# Patient Record
Sex: Male | Born: 1955 | Hispanic: No | Marital: Married | Smoking: Current every day smoker
Health system: Southern US, Community
[De-identification: ages and names within clinical notes are randomized; demographics above are authoritative.]

## PROBLEM LIST (undated history)

## (undated) DIAGNOSIS — M545 Low back pain, unspecified: Secondary | ICD-10-CM

## (undated) DIAGNOSIS — E7800 Pure hypercholesterolemia, unspecified: Secondary | ICD-10-CM

## (undated) DIAGNOSIS — I2510 Atherosclerotic heart disease of native coronary artery without angina pectoris: Secondary | ICD-10-CM

## (undated) DIAGNOSIS — E119 Type 2 diabetes mellitus without complications: Secondary | ICD-10-CM

## (undated) DIAGNOSIS — I420 Dilated cardiomyopathy: Secondary | ICD-10-CM

## (undated) DIAGNOSIS — G8929 Other chronic pain: Secondary | ICD-10-CM

---

## 2002-10-14 ENCOUNTER — Encounter: Admission: RE | Admit: 2002-10-14 | Discharge: 2003-01-12 | Payer: Self-pay | Admitting: Family Medicine

## 2005-03-03 ENCOUNTER — Emergency Department (HOSPITAL_COMMUNITY): Admission: EM | Admit: 2005-03-03 | Discharge: 2005-03-03 | Payer: Self-pay | Admitting: Emergency Medicine

## 2006-04-23 ENCOUNTER — Encounter: Admission: RE | Admit: 2006-04-23 | Discharge: 2006-04-23 | Payer: Self-pay | Admitting: Cardiology

## 2006-07-31 ENCOUNTER — Encounter: Admission: RE | Admit: 2006-07-31 | Discharge: 2006-07-31 | Payer: Self-pay | Admitting: Otolaryngology

## 2008-05-31 ENCOUNTER — Encounter: Admission: RE | Admit: 2008-05-31 | Discharge: 2008-05-31 | Payer: Self-pay | Admitting: Cardiology

## 2011-08-21 ENCOUNTER — Encounter (INDEPENDENT_AMBULATORY_CARE_PROVIDER_SITE_OTHER): Payer: BC Managed Care – PPO | Admitting: Ophthalmology

## 2011-08-21 DIAGNOSIS — E11319 Type 2 diabetes mellitus with unspecified diabetic retinopathy without macular edema: Secondary | ICD-10-CM

## 2011-08-21 DIAGNOSIS — H43819 Vitreous degeneration, unspecified eye: Secondary | ICD-10-CM

## 2011-08-21 DIAGNOSIS — H2510 Age-related nuclear cataract, unspecified eye: Secondary | ICD-10-CM

## 2012-08-20 ENCOUNTER — Encounter (INDEPENDENT_AMBULATORY_CARE_PROVIDER_SITE_OTHER): Payer: BC Managed Care – PPO | Admitting: Ophthalmology

## 2012-10-21 ENCOUNTER — Ambulatory Visit (INDEPENDENT_AMBULATORY_CARE_PROVIDER_SITE_OTHER): Payer: BC Managed Care – PPO | Admitting: Ophthalmology

## 2012-10-21 DIAGNOSIS — E1165 Type 2 diabetes mellitus with hyperglycemia: Secondary | ICD-10-CM

## 2012-10-21 DIAGNOSIS — E11319 Type 2 diabetes mellitus with unspecified diabetic retinopathy without macular edema: Secondary | ICD-10-CM

## 2012-10-21 DIAGNOSIS — H2510 Age-related nuclear cataract, unspecified eye: Secondary | ICD-10-CM

## 2012-10-21 DIAGNOSIS — H43819 Vitreous degeneration, unspecified eye: Secondary | ICD-10-CM

## 2012-11-18 ENCOUNTER — Encounter (INDEPENDENT_AMBULATORY_CARE_PROVIDER_SITE_OTHER): Payer: BC Managed Care – PPO | Admitting: Ophthalmology

## 2012-12-08 ENCOUNTER — Encounter (INDEPENDENT_AMBULATORY_CARE_PROVIDER_SITE_OTHER): Payer: BC Managed Care – PPO | Admitting: Ophthalmology

## 2012-12-08 DIAGNOSIS — E11319 Type 2 diabetes mellitus with unspecified diabetic retinopathy without macular edema: Secondary | ICD-10-CM

## 2012-12-08 DIAGNOSIS — H43819 Vitreous degeneration, unspecified eye: Secondary | ICD-10-CM

## 2012-12-08 DIAGNOSIS — E1139 Type 2 diabetes mellitus with other diabetic ophthalmic complication: Secondary | ICD-10-CM

## 2012-12-08 DIAGNOSIS — H2510 Age-related nuclear cataract, unspecified eye: Secondary | ICD-10-CM

## 2013-12-08 ENCOUNTER — Ambulatory Visit (INDEPENDENT_AMBULATORY_CARE_PROVIDER_SITE_OTHER): Payer: BC Managed Care – PPO | Admitting: Ophthalmology

## 2015-06-08 ENCOUNTER — Ambulatory Visit
Admission: RE | Admit: 2015-06-08 | Discharge: 2015-06-08 | Disposition: A | Discharge status: Home / Self Care | Payer: No Typology Code available for payment source | Source: Ambulatory Visit | Attending: Cardiology | Admitting: Cardiology

## 2015-06-08 ENCOUNTER — Other Ambulatory Visit: Payer: Self-pay | Admitting: Cardiology

## 2015-06-08 DIAGNOSIS — M25561 Pain in right knee: Secondary | ICD-10-CM

## 2015-08-19 ENCOUNTER — Other Ambulatory Visit (HOSPITAL_COMMUNITY): Payer: Self-pay | Admitting: Cardiology

## 2015-08-19 DIAGNOSIS — R0600 Dyspnea, unspecified: Secondary | ICD-10-CM

## 2015-08-19 DIAGNOSIS — R0789 Other chest pain: Secondary | ICD-10-CM

## 2015-08-25 ENCOUNTER — Telehealth (HOSPITAL_COMMUNITY): Payer: Self-pay | Admitting: *Deleted

## 2015-08-25 NOTE — Telephone Encounter (Signed)
Left message on voicemail in reference to upcoming appointment scheduled for 08/30/15. Phone number given for a call back so details instructions can be given. Krist Rosenboom J Makeshia Seat, RN 

## 2015-08-25 NOTE — Telephone Encounter (Signed)
Patient given detailed instructions per Myocardial Perfusion Study Information Sheet for test on 08/30/15 at 1200. Patient notified to arrive 15 minutes early and that it is imperative to arrive on time for appointment to keep from having the test rescheduled.  If you need to cancel or reschedule your appointment, please call the office within 24 hours of your appointment. Failure to do so may result in a cancellation of your appointment, and a $50 no show fee. Patient verbalized understanding. Kayron Hicklin J Kaleel Schmieder, RN 

## 2015-08-29 ENCOUNTER — Telehealth (HOSPITAL_COMMUNITY): Payer: Self-pay

## 2015-08-29 NOTE — Telephone Encounter (Signed)
Encounter complete. 

## 2015-08-30 ENCOUNTER — Other Ambulatory Visit: Payer: Self-pay

## 2015-08-30 ENCOUNTER — Ambulatory Visit (HOSPITAL_BASED_OUTPATIENT_CLINIC_OR_DEPARTMENT_OTHER): Payer: Medicaid Other

## 2015-08-30 ENCOUNTER — Ambulatory Visit (HOSPITAL_COMMUNITY): Payer: Medicaid Other | Attending: Cardiology

## 2015-08-30 DIAGNOSIS — R0600 Dyspnea, unspecified: Secondary | ICD-10-CM | POA: Diagnosis present

## 2015-08-30 DIAGNOSIS — I5189 Other ill-defined heart diseases: Secondary | ICD-10-CM | POA: Diagnosis not present

## 2015-08-30 DIAGNOSIS — E119 Type 2 diabetes mellitus without complications: Secondary | ICD-10-CM | POA: Diagnosis not present

## 2015-08-30 DIAGNOSIS — R0789 Other chest pain: Secondary | ICD-10-CM

## 2015-08-30 DIAGNOSIS — E785 Hyperlipidemia, unspecified: Secondary | ICD-10-CM | POA: Diagnosis not present

## 2015-08-30 DIAGNOSIS — I340 Nonrheumatic mitral (valve) insufficiency: Secondary | ICD-10-CM | POA: Insufficient documentation

## 2015-08-30 MED ORDER — TECHNETIUM TC 99M SESTAMIBI GENERIC - CARDIOLITE
29.5000 | Freq: Once | INTRAVENOUS | Status: AC | PRN
  Administered 2015-08-30: 30 via INTRAVENOUS

## 2015-09-01 ENCOUNTER — Ambulatory Visit (HOSPITAL_COMMUNITY): Payer: No Typology Code available for payment source | Attending: Cardiology

## 2015-09-01 DIAGNOSIS — I340 Nonrheumatic mitral (valve) insufficiency: Secondary | ICD-10-CM | POA: Diagnosis not present

## 2015-09-01 DIAGNOSIS — R0989 Other specified symptoms and signs involving the circulatory and respiratory systems: Secondary | ICD-10-CM

## 2015-09-01 LAB — MYOCARDIAL PERFUSION IMAGING
CHL CUP NUCLEAR SDS: 1
CHL CUP NUCLEAR SRS: 23
CHL CUP RESTING HR STRESS: 70 {beats}/min
LHR: 0.37
LV sys vol: 179 mL
LVDIAVOL: 232 mL
Peak HR: 85 {beats}/min
SSS: 24
TID: 0.99

## 2015-09-01 MED ORDER — TECHNETIUM TC 99M SESTAMIBI GENERIC - CARDIOLITE
31.5000 | Freq: Once | INTRAVENOUS | Status: AC | PRN
  Administered 2015-09-01: 32 via INTRAVENOUS

## 2015-09-01 MED ORDER — REGADENOSON 0.4 MG/5ML IV SOLN
0.4000 mg | Freq: Once | INTRAVENOUS | Status: AC
  Administered 2015-09-01: 0.4 mg via INTRAVENOUS

## 2015-09-15 ENCOUNTER — Other Ambulatory Visit: Payer: Self-pay | Admitting: Cardiovascular Disease

## 2015-09-16 ENCOUNTER — Encounter (HOSPITAL_COMMUNITY)
Admission: RE | Disposition: A | Discharge status: Home / Self Care | Payer: No Typology Code available for payment source | Source: Ambulatory Visit | Attending: Cardiothoracic Surgery

## 2015-09-16 ENCOUNTER — Ambulatory Visit (HOSPITAL_COMMUNITY): Payer: Medicaid Other

## 2015-09-16 ENCOUNTER — Inpatient Hospital Stay (HOSPITAL_COMMUNITY)
Admission: RE | Admit: 2015-09-16 | Discharge: 2015-09-26 | DRG: 234 | Disposition: A | Discharge status: Home / Self Care | Payer: Medicaid Other | Source: Ambulatory Visit | Attending: Cardiothoracic Surgery | Admitting: Cardiothoracic Surgery

## 2015-09-16 ENCOUNTER — Encounter (HOSPITAL_COMMUNITY): Payer: Self-pay | Admitting: *Deleted

## 2015-09-16 ENCOUNTER — Other Ambulatory Visit: Payer: Self-pay | Admitting: *Deleted

## 2015-09-16 DIAGNOSIS — Z8249 Family history of ischemic heart disease and other diseases of the circulatory system: Secondary | ICD-10-CM

## 2015-09-16 DIAGNOSIS — I25110 Atherosclerotic heart disease of native coronary artery with unstable angina pectoris: Principal | ICD-10-CM | POA: Diagnosis present

## 2015-09-16 DIAGNOSIS — E8770 Fluid overload, unspecified: Secondary | ICD-10-CM | POA: Diagnosis not present

## 2015-09-16 DIAGNOSIS — D62 Acute posthemorrhagic anemia: Secondary | ICD-10-CM | POA: Diagnosis not present

## 2015-09-16 DIAGNOSIS — R079 Chest pain, unspecified: Secondary | ICD-10-CM | POA: Diagnosis present

## 2015-09-16 DIAGNOSIS — I249 Acute ischemic heart disease, unspecified: Secondary | ICD-10-CM | POA: Diagnosis present

## 2015-09-16 DIAGNOSIS — F17210 Nicotine dependence, cigarettes, uncomplicated: Secondary | ICD-10-CM | POA: Diagnosis present

## 2015-09-16 DIAGNOSIS — I2510 Atherosclerotic heart disease of native coronary artery without angina pectoris: Secondary | ICD-10-CM

## 2015-09-16 DIAGNOSIS — K0510 Chronic gingivitis, plaque induced: Secondary | ICD-10-CM | POA: Diagnosis present

## 2015-09-16 DIAGNOSIS — Z4682 Encounter for fitting and adjustment of non-vascular catheter: Secondary | ICD-10-CM

## 2015-09-16 DIAGNOSIS — Z951 Presence of aortocoronary bypass graft: Secondary | ICD-10-CM

## 2015-09-16 DIAGNOSIS — E785 Hyperlipidemia, unspecified: Secondary | ICD-10-CM | POA: Diagnosis present

## 2015-09-16 DIAGNOSIS — I2582 Chronic total occlusion of coronary artery: Secondary | ICD-10-CM | POA: Diagnosis present

## 2015-09-16 DIAGNOSIS — J9811 Atelectasis: Secondary | ICD-10-CM | POA: Diagnosis not present

## 2015-09-16 DIAGNOSIS — I252 Old myocardial infarction: Secondary | ICD-10-CM

## 2015-09-16 DIAGNOSIS — Z833 Family history of diabetes mellitus: Secondary | ICD-10-CM

## 2015-09-16 DIAGNOSIS — T8384XA Pain from genitourinary prosthetic devices, implants and grafts, initial encounter: Secondary | ICD-10-CM | POA: Diagnosis not present

## 2015-09-16 DIAGNOSIS — E1165 Type 2 diabetes mellitus with hyperglycemia: Secondary | ICD-10-CM | POA: Diagnosis present

## 2015-09-16 DIAGNOSIS — Z7982 Long term (current) use of aspirin: Secondary | ICD-10-CM

## 2015-09-16 DIAGNOSIS — Z823 Family history of stroke: Secondary | ICD-10-CM

## 2015-09-16 DIAGNOSIS — Y846 Urinary catheterization as the cause of abnormal reaction of the patient, or of later complication, without mention of misadventure at the time of the procedure: Secondary | ICD-10-CM | POA: Diagnosis not present

## 2015-09-16 DIAGNOSIS — I493 Ventricular premature depolarization: Secondary | ICD-10-CM | POA: Diagnosis not present

## 2015-09-16 DIAGNOSIS — F419 Anxiety disorder, unspecified: Secondary | ICD-10-CM | POA: Diagnosis not present

## 2015-09-16 DIAGNOSIS — I420 Dilated cardiomyopathy: Secondary | ICD-10-CM | POA: Diagnosis present

## 2015-09-16 HISTORY — DX: Dilated cardiomyopathy: I42.0

## 2015-09-16 HISTORY — PX: CARDIAC CATHETERIZATION: SHX172

## 2015-09-16 HISTORY — DX: Atherosclerotic heart disease of native coronary artery without angina pectoris: I25.10

## 2015-09-16 HISTORY — DX: Pure hypercholesterolemia, unspecified: E78.00

## 2015-09-16 HISTORY — DX: Low back pain, unspecified: M54.50

## 2015-09-16 HISTORY — DX: Low back pain: M54.5

## 2015-09-16 HISTORY — DX: Type 2 diabetes mellitus without complications: E11.9

## 2015-09-16 HISTORY — DX: Other chronic pain: G89.29

## 2015-09-16 LAB — BRAIN NATRIURETIC PEPTIDE: B NATRIURETIC PEPTIDE 5: 130.8 pg/mL — AB (ref 0.0–100.0)

## 2015-09-16 LAB — PULMONARY FUNCTION TEST
FEF 25-75 Pre: 1.81 L/sec
FEF2575-%Pred-Pre: 76 %
FEV1-%Pred-Pre: 75 %
FEV1-Pre: 2.04 L
FEV1FVC-%Pred-Pre: 95 %
FEV6-%Pred-Pre: 83 %
FEV6-Pre: 2.8 L
FEV6FVC-%Pred-Pre: 105 %
FVC-%Pred-Pre: 78 %
FVC-Pre: 2.8 L
Pre FEV1/FVC ratio: 73 %
Pre FEV6/FVC Ratio: 100 %

## 2015-09-16 LAB — BASIC METABOLIC PANEL
ANION GAP: 10 (ref 5–15)
BUN: 13 mg/dL (ref 6–20)
CHLORIDE: 104 mmol/L (ref 101–111)
CO2: 24 mmol/L (ref 22–32)
CREATININE: 0.62 mg/dL (ref 0.61–1.24)
Calcium: 9.5 mg/dL (ref 8.9–10.3)
GFR calc non Af Amer: >60 mL/min (ref >60)
Glucose, Bld: 214 mg/dL — ABNORMAL HIGH (ref 65–99)
POTASSIUM: 4.1 mmol/L (ref 3.5–5.1)
SODIUM: 138 mmol/L (ref 135–145)

## 2015-09-16 LAB — GLUCOSE, CAPILLARY
GLUCOSE-CAPILLARY: 209 mg/dL — AB (ref 65–99)
GLUCOSE-CAPILLARY: 166 mg/dL — AB (ref 65–99)
GLUCOSE-CAPILLARY: 238 mg/dL — AB (ref 65–99)
Glucose-Capillary: 177 mg/dL — ABNORMAL HIGH (ref 65–99)
Glucose-Capillary: 200 mg/dL — ABNORMAL HIGH (ref 65–99)

## 2015-09-16 LAB — CBC
HEMATOCRIT: 47.3 % (ref 39.0–52.0)
HEMOGLOBIN: 15.6 g/dL (ref 13.0–17.0)
MCH: 28.2 pg (ref 26.0–34.0)
MCHC: 33 g/dL (ref 30.0–36.0)
MCV: 85.4 fL (ref 78.0–100.0)
Platelets: 205 10*3/uL (ref 150–400)
RBC: 5.54 MIL/uL (ref 4.22–5.81)
RDW: 13.4 % (ref 11.5–15.5)
WBC: 5.5 10*3/uL (ref 4.0–10.5)

## 2015-09-16 LAB — PROTIME-INR
INR: 0.96 (ref 0.00–1.49)
Prothrombin Time: 13 seconds (ref 11.6–15.2)

## 2015-09-16 LAB — POCT I-STAT 3, ART BLOOD GAS (G3+)
Acid-Base Excess: 2 mmol/L (ref 0.0–2.0)
Bicarbonate: 29.1 mEq/L — ABNORMAL HIGH (ref 20.0–24.0)
O2 Saturation: 98 %
PCO2 ART: 53.5 mmHg — AB (ref 35.0–45.0)
PH ART: 7.344 — AB (ref 7.350–7.450)
TCO2: 31 mmol/L (ref 0–100)
pO2, Arterial: 108 mmHg — ABNORMAL HIGH (ref 80.0–100.0)

## 2015-09-16 LAB — POCT I-STAT 3, VENOUS BLOOD GAS (G3P V)
BICARBONATE: 26.8 meq/L — AB (ref 20.0–24.0)
O2 Saturation: 73 %
PCO2 VEN: 53.2 mmHg — AB (ref 45.0–50.0)
PH VEN: 7.31 — AB (ref 7.250–7.300)
PO2 VEN: 43 mmHg (ref 30.0–45.0)
TCO2: 28 mmol/L (ref 0–100)

## 2015-09-16 LAB — TROPONIN I: Troponin I: <0.03 ng/mL (ref <0.031)

## 2015-09-16 LAB — PLATELET INHIBITION P2Y12: Platelet Function  P2Y12: 199 [PRU] (ref 194–418)

## 2015-09-16 SURGERY — RIGHT/LEFT HEART CATH AND CORONARY ANGIOGRAPHY

## 2015-09-16 MED ORDER — INSULIN ASPART 100 UNIT/ML ~~LOC~~ SOLN
0.0000 [IU] | Freq: Three times a day (TID) | SUBCUTANEOUS | Status: DC
  Administered 2015-09-17 (×3): 3 [IU] via SUBCUTANEOUS
  Administered 2015-09-18: 8 [IU] via SUBCUTANEOUS
  Administered 2015-09-18 (×2): 3 [IU] via SUBCUTANEOUS
  Administered 2015-09-19: 5 [IU] via SUBCUTANEOUS

## 2015-09-16 MED ORDER — ACETAMINOPHEN 325 MG PO TABS: 650.0000 mg | ORAL_TABLET | ORAL | Status: DC | PRN

## 2015-09-16 MED ORDER — ONDANSETRON HCL 4 MG/2ML IJ SOLN: 4.0000 mg | Freq: Four times a day (QID) | INTRAMUSCULAR | Status: DC | PRN

## 2015-09-16 MED ORDER — LIDOCAINE HCL (PF) 1 % IJ SOLN
INTRAMUSCULAR | Status: AC
  Filled 2015-09-16: qty 30

## 2015-09-16 MED ORDER — SODIUM CHLORIDE 0.9 % IJ SOLN: 3.0000 mL | Freq: Two times a day (BID) | INTRAMUSCULAR | Status: DC

## 2015-09-16 MED ORDER — SODIUM CHLORIDE 0.9 % IV SOLN: 250.0000 mL | INTRAVENOUS | Status: DC | PRN

## 2015-09-16 MED ORDER — SODIUM CHLORIDE 0.9 % IV SOLN
250.0000 mL | INTRAVENOUS | Status: DC | PRN
  Administered 2015-09-19: 14:00:00 via INTRAVENOUS

## 2015-09-16 MED ORDER — ASPIRIN 81 MG PO CHEW
81.0000 mg | CHEWABLE_TABLET | ORAL | Status: AC
  Administered 2015-09-16: 81 mg via ORAL

## 2015-09-16 MED ORDER — ALBUTEROL SULFATE (2.5 MG/3ML) 0.083% IN NEBU: 2.5000 mg | INHALATION_SOLUTION | Freq: Once | RESPIRATORY_TRACT | Status: DC

## 2015-09-16 MED ORDER — SODIUM CHLORIDE 0.9 % IJ SOLN: 3.0000 mL | INTRAMUSCULAR | Status: DC | PRN

## 2015-09-16 MED ORDER — FENTANYL CITRATE (PF) 100 MCG/2ML IJ SOLN
INTRAMUSCULAR | Status: AC
  Filled 2015-09-16: qty 4

## 2015-09-16 MED ORDER — FENTANYL CITRATE (PF) 100 MCG/2ML IJ SOLN
INTRAMUSCULAR | Status: DC | PRN
  Administered 2015-09-16 (×2): 25 ug via INTRAVENOUS

## 2015-09-16 MED ORDER — SODIUM CHLORIDE 0.9 % IV SOLN
INTRAVENOUS | Status: DC
  Administered 2015-09-16: 11:00:00 via INTRAVENOUS

## 2015-09-16 MED ORDER — ASPIRIN EC 81 MG PO TBEC
81.0000 mg | DELAYED_RELEASE_TABLET | Freq: Every day | ORAL | Status: DC
  Administered 2015-09-17 – 2015-09-18 (×2): 81 mg via ORAL
  Filled 2015-09-16 (×2): qty 1

## 2015-09-16 MED ORDER — ALBUTEROL SULFATE (2.5 MG/3ML) 0.083% IN NEBU
2.5000 mg | INHALATION_SOLUTION | Freq: Four times a day (QID) | RESPIRATORY_TRACT | Status: DC
  Filled 2015-09-16: qty 3

## 2015-09-16 MED ORDER — GLIMEPIRIDE 4 MG PO TABS
4.0000 mg | ORAL_TABLET | Freq: Two times a day (BID) | ORAL | Status: DC
  Administered 2015-09-16 – 2015-09-18 (×5): 4 mg via ORAL
  Filled 2015-09-16 (×7): qty 1

## 2015-09-16 MED ORDER — ALBUTEROL SULFATE (2.5 MG/3ML) 0.083% IN NEBU: 2.5000 mg | INHALATION_SOLUTION | Freq: Four times a day (QID) | RESPIRATORY_TRACT | Status: DC | PRN

## 2015-09-16 MED ORDER — ADULT MULTIVITAMIN W/MINERALS CH
1.0000 | ORAL_TABLET | Freq: Every day | ORAL | Status: DC
  Administered 2015-09-16 – 2015-09-18 (×3): 1 via ORAL
  Filled 2015-09-16 (×3): qty 1

## 2015-09-16 MED ORDER — ASPIRIN 81 MG PO CHEW
CHEWABLE_TABLET | ORAL | Status: AC
  Filled 2015-09-16: qty 1

## 2015-09-16 MED ORDER — MIDAZOLAM HCL 2 MG/2ML IJ SOLN
INTRAMUSCULAR | Status: AC
  Filled 2015-09-16: qty 4

## 2015-09-16 MED ORDER — HEPARIN (PORCINE) IN NACL 2-0.9 UNIT/ML-% IJ SOLN
INTRAMUSCULAR | Status: AC
  Filled 2015-09-16: qty 500

## 2015-09-16 MED ORDER — HEPARIN (PORCINE) IN NACL 100-0.45 UNIT/ML-% IJ SOLN
1500.0000 [IU]/h | INTRAMUSCULAR | Status: DC
  Administered 2015-09-16: 800 [IU]/h via INTRAVENOUS
  Administered 2015-09-17: 1350 [IU]/h via INTRAVENOUS
  Filled 2015-09-16 (×4): qty 250

## 2015-09-16 MED ORDER — SODIUM CHLORIDE 0.9 % IJ SOLN
3.0000 mL | Freq: Two times a day (BID) | INTRAMUSCULAR | Status: DC
  Administered 2015-09-16 – 2015-09-18 (×5): 3 mL via INTRAVENOUS

## 2015-09-16 MED ORDER — MIDAZOLAM HCL 2 MG/2ML IJ SOLN
INTRAMUSCULAR | Status: DC | PRN
  Administered 2015-09-16 (×2): 1 mg via INTRAVENOUS

## 2015-09-16 MED ORDER — HEPARIN (PORCINE) IN NACL 2-0.9 UNIT/ML-% IJ SOLN
INTRAMUSCULAR | Status: AC
  Filled 2015-09-16: qty 1000

## 2015-09-16 MED ORDER — INSULIN ASPART 100 UNIT/ML ~~LOC~~ SOLN
0.0000 [IU] | Freq: Once | SUBCUTANEOUS | Status: AC
  Administered 2015-09-16: 5 [IU] via SUBCUTANEOUS

## 2015-09-16 SURGICAL SUPPLY — 12 items
CATH INFINITI 5FR MULTPACK ANG (CATHETERS) ×3 IMPLANT
CATH SWAN GANZ 7F STRAIGHT (CATHETERS) ×3 IMPLANT
KIT HEART LEFT (KITS) ×3 IMPLANT
KIT HEART RIGHT NAMIC (KITS) ×3 IMPLANT
NDL SMART REG 18GX2-3/4 (NEEDLE) IMPLANT
NEEDLE SMART REG 18GX2-3/4 (NEEDLE) ×3 IMPLANT
PACK CARDIAC CATHETERIZATION (CUSTOM PROCEDURE TRAY) ×3 IMPLANT
SHEATH PINNACLE 5F 10CM (SHEATH) ×3 IMPLANT
SHEATH PINNACLE 7F 10CM (SHEATH) ×3 IMPLANT
SYR MEDRAD MARK V 150ML (SYRINGE) ×3 IMPLANT
TRANSDUCER W/STOPCOCK (MISCELLANEOUS) ×6 IMPLANT
WIRE EMERALD 3MM-J .035X150CM (WIRE) ×3 IMPLANT

## 2015-09-16 NOTE — Progress Notes (Signed)
Dr. Algie Coffer and PA talking w/patient

## 2015-09-16 NOTE — Progress Notes (Signed)
ANTICOAGULATION CONSULT NOTE - Initial Consult  Pharmacy Consult for heparin Indication: chest pain/ACS  No Known Allergies  Patient Measurements: Height:  (157.5 cm) Weight: 154 lb 1.6 oz (69.9 kg) (Bed weight r/t bedrest from femoral cath) IBW/kg (Calculated) : 54.6 Heparin Dosing Weight: 68.7 kg  Vital Signs: Temp: 98.2 F (36.8 C) (09/30 1508) Temp Source: Oral (09/30 1508) BP: 126/73 mmHg (09/30 1508) Pulse Rate: 73 (09/30 1508)  Labs:  Recent Labs  09/16/15 1044  HGB 15.6  HCT 47.3  PLT 205  LABPROT 13.0  INR 0.96  CREATININE 0.62    Estimated Creatinine Clearance: 86.4 mL/min (by C-G formula based on Cr of 0.62).   Medical History: History reviewed. No pertinent past medical history.   Assessment: 58 YOM with dilated cardiomyopathy has chest pain and exertional shortness of breath, s/p cath this afternoon and found to have severe CAD requiring CABG. Pharmacy is consulted to start IV heparin 8 hrs after sheath removal (1415). Plan for CABG Monday 10/3. He is not on anticoagulation prior to admission. Baseline hgb 15.6, plt wnl, INR 0.96.  Goal of Therapy:  Heparin level 0.3-0.7 units/ml Monitor platelets by anticoagulation protocol: Yes   Plan:  - Heparin infusion 800 units/hr with no bolus at 2215 - f/u heparin AM heparin level - Daily heparin level and CBC - f/u plans for CABG  Bayard Hugger, PharmD, BCPS  Clinical Pharmacist  Pager: [phone]   09/16/2015,3:50 PM

## 2015-09-16 NOTE — H&P (Signed)
Referring Physician:  WYLIE COON is an 59 y.o. male.                       Chief Complaint: Chest pain  HPI: 59 year old male with dilated cardiomyopathy has chest pain and exertional shortness of breath.  He is tired all the time.  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 No alcohol use, No drug use, + elevated cholesterol, + MI, No obesity, No exercise, No FH of premature CAD.  Past surgical history: None  Family history : Mom, living, 108 yr old has DM, II. Dad died age 63 from stroke and had MI age 54. Nine brothers, all living, 4 with DM, II. 4 sisters all living and well.  Social History:  has decreased smoking to 1/4 pack per day x 2 months with 40 pack year history of smoking.  Personal: Married, wife Armando Reichert 49 yr old. 1 son-18 yr and 3 daughters 62.13 and 8 yr old.  Allergies: No Known Allergies  Prescriptions prior to admission: Glymepiride 4 mg.  Twice daily. Metformin 1000 mg. One twice daily. Aspirin 325 mg. One daily. Crestor 5 mg. One daily.    No results found for this or any previous visit (from the past 48 hour(s)). No results found.  Review Of Systems No weight gain/loss, Wears reading and driving glasses. No cataract surgery. + hearing loss right ear. No dentures, no asthma, + COPD, No pneumonia, No palpitations, + Chest pain. + Claudication, No nausea, vomiting or diarrhea, No GI or GU bleed. No hepatitis, No blood transfusion, No kidney stone, No stroke, seizures or psychiatric admission. + joint pains No rash.  There were no vitals taken for this visit. General appearance: alert, appears older than stated age and no distress Eyes: Black eyes, wears glasses, conjunctivae/corneas clear. PERRL, EOM's intact. Fundi benign. Throat- chronic gingivitis. Resp: clear to auscultation bilaterally Cardio: regular rate and rhythm, S1, S2 normal, no murmur, click, rub or gallop GI: soft, non-tender; bowel sounds normal; no masses,   no organomegaly Extremities: extremities normal, atraumatic, no cyanosis or edema Skin: Skin color, texture, turgor normal. No rashes or lesions Neurologic: Alert and oriented X 3, normal strength and tone. Normal symmetric reflexes. Normal coordination and gait  Assessment/Plan Right and Left heart catheterization. Add Lisinopril 5 mg. one daily. Change Crestor to Pravachol for insurance coverage.  Patient understood procedure, risks and alternatives and wants me to peoceed with the procedure.  Ricki Rodriguez, MD  09/16/2015, 10:43 AM

## 2015-09-16 NOTE — Consult Note (Signed)
[address]       [place] [address]             [phone]        ZAKAR BROSCH [facility] Medical Record #[id number] Date of Birth: 09/30/1956  Referring: Algie Coffer Primary Care: Pola Corn, MD  Chief Complaint:   Fatigue, CAD  History of Present Illness:      Mr. Riddle is a 59 yo male with history of DM, Hyperlipidemia, and smoking history of 2ppd for 20 years.  He states he had not been seen by a physician in 2 years.  He presented to his PCP with complaints of fatigue.  Workup included EKG which had changes likely indicating an old myocardial infarction.  It was felt stress test should be done as well as echocardiogram.  These showed cardiomyopathy with an EF of 25% and positive evidence of ischemia.  Due to this he was referred to Dr. Algie Coffer for further workup.  He felt the patient should undergo cardiac catheterization for further workup.  This was done today and showed severe 3 vessel CAD. Cardiac Surgery was consulted.   He states he feels fatigued which he attributes to working late at night.  He denies chest pain, palpitations, and shortness of breath.  He states that after he eats a large meal he gets a "stress" feeling along his epigastrium.  The patient does not seem to fully understand the severity of his CAD despite extensive counseling by Dr. Algie Coffer.   Current Activity/ Functional Status: Patient is independent with mobility/ambulation, transfers, ADL's, IADL's.   Zubrod Score: At the time of surgery this patient's most appropriate activity status/level should be described as:     0    Normal activity, no symptoms     1    Restricted in physical strenuous activity but ambulatory, able to do out light work     2    Ambulatory and capable of self care, unable to do work activities, up and about                 more than 50%  Of the time                                3    Only limited self care, in bed greater than 50% of waking hours      4    Completely disabled, no self care, confined to bed or chair     5    Moribund  Past medical History: Long standing DM- patient notes that his glucose is always 200-300 Denies previous storke  Past Medical History  Diagnosis Date  . Coronary artery disease   . Hypercholesterolemia     "diet controlled" (09/16/2015)  . Type II diabetes mellitus   . Chronic lower back pain   . Dilated cardiomyopathy     /notes 09/16/2015   Past Surgical History  Procedure Laterality Date  . Cardiac catheterization N/A 09/16/2015    Procedure: Right/Left Heart Cath and Coronary Angiography;  Surgeon: Orpah Cobb, MD;  Location: [facility];  Service: Cardiovascular;  Laterality: N/A;        History  Smoking status  . Current Every Day Smoker -- 1.25 packs/day for 21 years  . Types: Cigarettes  Smokeless tobacco  . Never Used    History  Alcohol Use No    Social  History   Social History  . Marital Status: Married    Spouse Name: N/A  . Number of Children: N/A  . Years of Education: N/A   Occupational History  . Not on file.   Social History Main Topics  . Smoking status: Current Every Day Smoker -- 1.25 packs/day for 21 years    Types: Cigarettes  . Smokeless tobacco: Never Used  . Alcohol Use: No  . Drug Use: No  . Sexual Activity: Yes   Other Topics Concern  . Not on file   Social History Narrative  . No narrative on file    No Known Allergies  Current Facility-Administered Medications  Medication Dose Route Frequency Provider Last Rate Last Dose  . 0.9 %  sodium chloride infusion  250 mL Intravenous PRN Orpah Cobb, MD      . acetaminophen (TYLENOL) tablet 650 mg  650 mg Oral Q4H PRN Orpah Cobb, MD      . albuterol (PROVENTIL) (2.5 MG/3ML) 0.083% nebulizer solution 2.5 mg  2.5 mg Nebulization Once [name], MD   2.5 mg at 09/16/15 1547  . albuterol (PROVENTIL) (2.5 MG/3ML) 0.083% nebulizer solution 2.5 mg  2.5 mg Nebulization Q6H PRN Orpah Cobb, MD      . aspirin EC tablet 81 mg  81 mg Oral Daily Orpah Cobb, MD      . glimepiride (AMARYL) tablet 4 mg  4 mg Oral BID Orpah Cobb, MD   4 mg at 09/16/15 2216  . heparin ADULT infusion 100 units/mL (25000 units/250 mL)  1,100 Units/hr Intravenous Continuous Orpah Cobb, MD 11 mL/hr at 09/17/15 0355 1,100 Units/hr at 09/17/15 0355  . insulin aspart (novoLOG) injection 0-15 Units  0-15 Units Subcutaneous TID WC Orpah Cobb, MD   3 Units at 09/17/15 0619  . multivitamin with minerals tablet 1 tablet  1 tablet Oral Daily Orpah Cobb, MD   1 tablet at 09/16/15 1719  . ondansetron (ZOFRAN) injection 4 mg  4 mg Intravenous Q6H PRN Orpah Cobb, MD      . sodium chloride 0.9 % injection 3 mL  3 mL Intravenous Q12H Orpah Cobb, MD   3 mL at 09/16/15 1720  . sodium chloride 0.9 % injection 3 mL  3 mL Intravenous PRN Orpah Cobb, MD        Prescriptions prior to admission  Medication Sig Dispense Refill Last Dose  . aspirin EC 81 MG tablet Take 81 mg by mouth daily.   09/15/2015 at Unknown time  . glimepiride (AMARYL) 4 MG tablet Take 4 mg by mouth 2 (two) times daily.   09/15/2015 at Unknown time  . metFORMIN (GLUCOPHAGE) 1000 MG tablet Take 1,000 mg by mouth 2 (two) times daily with a meal.   09/15/2015 at Unknown time    History reviewed. No pertinent family history.  Review of Systems:  Constitutional: negative Eyes: negative Respiratory: negative for cough, dyspnea on exertion and pleurisy/chest pain Cardiovascular: negative for chest pain, chest pressure/discomfort, dyspnea, exertional chest pressure/discomfort and irregular heart beat Gastrointestinal: negative for abdominal pain, nausea and vomiting Hematologic/lymphatic: negative Musculoskeletal:negative Neurological: negative    Cardiac Review of Systems: Y or N  Chest Pain [ n   ]  Resting SOB [ n  ] Exertional SOB  Cove.Etienne ]  Orthopnea Cove.Etienne  ]   Pedal Edema [ n  ]    Palpitations [ n ] Syncope  [n  ]   Presyncope [ n   ]  General Review of  Systems: [Y] = yes [  ]=no Constitional: recent weight change [n  ]; anorexia [  ]; fatigue [  ]; nausea [n  ]; night sweats [  ]; fever [  ]; or chills [  ]                                                               Dental: poor dentition[  ]; Last Dentist visit:   Eye : blurred vision [ n ]; diplopia [   ]; vision changes [  ];  Amaurosis fugax[  ]; Resp: cough [n  ];  wheezing[n  ];  hemoptysis[  ]; shortness of breath[n  ]; paroxysmal nocturnal dyspnea[  ]; dyspnea on exertion[ n ]; or orthopnea[  ];  GI:  gallstones[  ], vomiting[ n ];  dysphagia[  ]; melena[  ];  hematochezia [  ]; heartburn[y  ];   Hx of  Colonoscopy[  ]; GU: kidney stones [  ]; hematuria[  ];   dysuria [  ];  nocturia[  ];  history of     obstruction [  ]; urinary frequency [  ]             Skin: rash, swelling[n  ];, hair loss[  ];  peripheral edema[  ];  or itching[  ]; Musculosketetal: myalgias[  ];  joint swelling[  ];  joint erythema[  ];  joint pain[  ];  back pain[  ];  Heme/Lymph: bruising[n  ];  bleeding[  ];  anemia[  ];  Neuro: TIA[  ];  headaches[ n ];  stroke[  ];  vertigo[  ];  seizures[  ];   paresthesias[ n ];  difficulty walking[  ];  Psych:depression[  ]; anxiety[  ];  Endocrine: diabetes[y  ];  thyroid dysfunction[  ];  Immunizations: Flu [ n ]; Pneumococcal[ n ];  Other:  Physical Exam: BP 127/65 mmHg  Pulse 74  Temp(Src) 98.2 F (36.8 C) (Oral)  Resp 18  Ht 5\' 2"  (1.575 m)  Wt 153 lb 4.8 oz (69.536 kg)  BMI 28.03 kg/m2  SpO2 97%  General appearance: alert, cooperative and no distress Head: Normocephalic, without obvious abnormality, atraumatic Lymph nodes: Cervical, supraclavicular, and axillary nodes normal. Resp: clear to auscultation bilaterally Cardio: regular rate and rhythm GI: soft, non-tender; bowel sounds normal; no masses,  no organomegaly Extremities: extremities normal, atraumatic, no cyanosis or edema and very few scattered spider veins Neurologic:  Grossly normal  Diagnostic Studies & Laboratory data:  Cardiac Catheterization:  Prox RCA lesion, 100% stenosed. The lesion was not previously treated.  Dist RCA lesion, 100% stenosed. The lesion was not previously treated.  Prox Cx lesion, 100% stenosed. The lesion was not previously treated.  Mid Cx lesion, 100% stenosed. The lesion was not previously treated.  Ost 1st Sept lesion, 95% stenosed. The lesion was not previously treated.  Prox LAD lesion, 99% stenosed. The lesion was not previously treated.  1st Diag lesion, 50% stenosed. The lesion was not previously treated.  Ost LAD lesion, 65% stenosed. The lesion was not previously treated.     Recent Radiology Findings:   No results found.   I have independently reviewed the above radiologic studies.  Recent Lab Findings: Lab Results  Component Value Date  WBC 5.8 09/17/2015   HGB 14.1 09/17/2015   HCT 42.3 09/17/2015   PLT 207 09/17/2015   GLUCOSE 268* 09/17/2015   ALT 11* 09/17/2015   AST 13* 09/17/2015   NA 135 09/17/2015   K 4.0 09/17/2015   CL 101 09/17/2015   CREATININE 1.01 09/17/2015   BUN 15 09/17/2015   CO2 27 09/17/2015   INR 1.15 09/17/2015   HGBA1C 9.9* 09/16/2015   ECHO:08/2015 Study Conclusions  - Left ventricle: The cavity size was normal. Wall thickness was normal. Systolic function was severely reduced. The estimated ejection fraction was in the range of 20% to 25%. Diffuse hypokinesis. There is akinesis of the anteroseptal and apical myocardium. There is akinesis of the mid-apicalinferolateral myocardium. Doppler parameters are consistent with abnormal left ventricular relaxation (grade 1 diastolic dysfunction). - Mitral valve: There was mild regurgitation.  Impressions:  - Multiple wall motion abnormalities as outlined with overall severely reduced LV systolic function; grade 1 diastolic dysfunction; mild MR.  Assessment / Plan:      1. Severe CAD- with vague  symptoms mostly fatigue, NYHA Functional Class II Objective Assessment C 2. DM- poor control Hga1c 9.9  3. Nicotine abuse 4. Hyperlipidemia I have discussed with the patient and his wife the dx of severe 3 vessel CAD in setting significant depressed LV function. I agree with Dr Algie Coffer and have recommended to the patient that we proceed with CABG on this admission due to critical 3 vessel disease and depressed lv function. Post op will need to consider AICD. The patient wants to go home but I have recommended against this. He will discuss with his family and decided if he wishes to proceed with cabg. He is concerned about surgery because his mother in law had stroke 10 days postop after cabg in [place]   I  spent 40 minutes counseling the patient face to face and 50% or more the  time was spent in counseling and coordination of care. The total time spent in the appointment was .  Patient seen 9/30 [name]      [address] [place] [address] Office [phone]   Beeper [phone]   09/17/2015 8:13 AM

## 2015-09-16 NOTE — Progress Notes (Signed)
Site area: rt groin fa and fv sheath Site Prior to Removal:  Level 0 Pressure Applied For:  20 minutes Manual:   yes Patient Status During Pull:  stable Post Pull Site:  Level  0 Post Pull Instructions Given:   Yes   Post Pull Pulses Present: yes Dressing Applied:  tegaderm Bedrest begins @ 1415 Comments:  0

## 2015-09-17 ENCOUNTER — Ambulatory Visit (HOSPITAL_COMMUNITY): Payer: Medicaid Other

## 2015-09-17 ENCOUNTER — Ambulatory Visit (HOSPITAL_BASED_OUTPATIENT_CLINIC_OR_DEPARTMENT_OTHER): Payer: Medicaid Other

## 2015-09-17 DIAGNOSIS — Z01810 Encounter for preprocedural cardiovascular examination: Secondary | ICD-10-CM

## 2015-09-17 LAB — CBC
HCT: 42.3 % (ref 39.0–52.0)
Hemoglobin: 14.1 g/dL (ref 13.0–17.0)
MCH: 28.3 pg (ref 26.0–34.0)
MCHC: 33.3 g/dL (ref 30.0–36.0)
MCV: 84.8 fL (ref 78.0–100.0)
PLATELETS: 207 10*3/uL (ref 150–400)
RBC: 4.99 MIL/uL (ref 4.22–5.81)
RDW: 13.5 % (ref 11.5–15.5)
WBC: 5.8 10*3/uL (ref 4.0–10.5)

## 2015-09-17 LAB — GLUCOSE, CAPILLARY
GLUCOSE-CAPILLARY: 170 mg/dL — AB (ref 65–99)
GLUCOSE-CAPILLARY: 190 mg/dL — AB (ref 65–99)
GLUCOSE-CAPILLARY: 284 mg/dL — AB (ref 65–99)
Glucose-Capillary: 193 mg/dL — ABNORMAL HIGH (ref 65–99)

## 2015-09-17 LAB — COMPREHENSIVE METABOLIC PANEL
ALBUMIN: 3.5 g/dL (ref 3.5–5.0)
ALT: 11 U/L — AB (ref 17–63)
AST: 13 U/L — AB (ref 15–41)
Alkaline Phosphatase: 49 U/L (ref 38–126)
Anion gap: 7 (ref 5–15)
BUN: 15 mg/dL (ref 6–20)
CHLORIDE: 101 mmol/L (ref 101–111)
CO2: 27 mmol/L (ref 22–32)
CREATININE: 1.01 mg/dL (ref 0.61–1.24)
Calcium: 9.1 mg/dL (ref 8.9–10.3)
GFR calc Af Amer: >60 mL/min (ref >60)
GFR calc non Af Amer: >60 mL/min (ref >60)
Glucose, Bld: 268 mg/dL — ABNORMAL HIGH (ref 65–99)
POTASSIUM: 4 mmol/L (ref 3.5–5.1)
SODIUM: 135 mmol/L (ref 135–145)
Total Bilirubin: 0.5 mg/dL (ref 0.3–1.2)
Total Protein: 6 g/dL — ABNORMAL LOW (ref 6.5–8.1)

## 2015-09-17 LAB — PROTIME-INR
INR: 1.15 (ref 0.00–1.49)
Prothrombin Time: 14.9 seconds (ref 11.6–15.2)

## 2015-09-17 LAB — HEMOGLOBIN A1C
Hgb A1c MFr Bld: 9.9 % — ABNORMAL HIGH (ref 4.8–5.6)
Mean Plasma Glucose: 237 mg/dL

## 2015-09-17 LAB — URINALYSIS, ROUTINE W REFLEX MICROSCOPIC
Bilirubin Urine: NEGATIVE
Glucose, UA: NEGATIVE mg/dL
Hgb urine dipstick: NEGATIVE
Ketones, ur: NEGATIVE mg/dL
Leukocytes, UA: NEGATIVE
Nitrite: NEGATIVE
Protein, ur: NEGATIVE mg/dL
Specific Gravity, Urine: 1.015 (ref 1.005–1.030)
Urobilinogen, UA: 1 mg/dL (ref 0.0–1.0)
pH: 6.5 (ref 5.0–8.0)

## 2015-09-17 LAB — HEPARIN LEVEL (UNFRACTIONATED)
Heparin Unfractionated: 0.14 IU/mL — ABNORMAL LOW (ref 0.30–0.70)
Heparin Unfractionated: 0.28 IU/mL — ABNORMAL LOW (ref 0.30–0.70)

## 2015-09-17 LAB — TROPONIN I: Troponin I: <0.03 ng/mL (ref <0.031)

## 2015-09-17 MED ORDER — ATORVASTATIN CALCIUM 80 MG PO TABS
80.0000 mg | ORAL_TABLET | Freq: Every day | ORAL | Status: DC
  Administered 2015-09-17 – 2015-09-25 (×8): 80 mg via ORAL
  Filled 2015-09-17 (×9): qty 1

## 2015-09-17 MED ORDER — CARVEDILOL 3.125 MG PO TABS
3.1250 mg | ORAL_TABLET | Freq: Two times a day (BID) | ORAL | Status: DC
  Administered 2015-09-17 – 2015-09-19 (×4): 3.125 mg via ORAL
  Filled 2015-09-17 (×6): qty 1

## 2015-09-17 NOTE — Progress Notes (Signed)
Subjective:  Patient denies any chest pain or shortness of breath. Discussed at length with patient regarding CABG and agrees. Understands the risk and benefit.  Objective:  Vital Signs in the last 24 hours: Temp:  [97.9 F (36.6 C)-98.2 F (36.8 C)] 98.2 F (36.8 C) (10/01 0533) Pulse Rate:  [0-165] 74 (10/01 0533) Resp:  [0-21] 18 (10/01 0533) BP: (107-137)/(54-85) 127/65 mmHg (10/01 0533) SpO2:  [0 %-100 %] 97 % (10/01 0533) Weight:  [69.536 kg (153 lb 4.8 oz)-69.9 kg (154 lb 1.6 oz)] 69.536 kg (153 lb 4.8 oz) (10/01 0533)  Intake/Output from previous day: 09/30 0701 - 10/01 0700 In: 1185 [P.O.:1117; I.V.:68] Out: 1825 [Urine:1825] Intake/Output from this shift: Total I/O In: 280 [P.O.:280] Out: 400 [Urine:400]  Physical Exam: Neck: no adenopathy, no carotid bruit, no JVD and supple, symmetrical, trachea midline Lungs: clear to auscultation bilaterally Heart: regular rate and rhythm, S1, S2 normal and Soft systolic murmur noted Abdomen: soft, non-tender; bowel sounds normal; no masses,  no organomegaly Extremities: extremities normal, atraumatic, no cyanosis or edema and Right groin stable Pulses: 2+ and symmetric  Lab Results:  Recent Labs  09/16/15 1044 09/17/15 0151  WBC 5.5 5.8  HGB 15.6 14.1  PLT 205 207    Recent Labs  09/16/15 1044 09/17/15 0151  NA 138 135  K 4.1 4.0  CL 104 101  CO2 24 27  GLUCOSE 214* 268*  BUN 13 15  CREATININE 0.62 1.01    Recent Labs  09/16/15 1936 09/17/15 0151  TROPONINI <0.03 <0.03   Hepatic Function Panel  Recent Labs  09/17/15 0151  PROT 6.0*  ALBUMIN 3.5  AST 13*  ALT 11*  ALKPHOS 49  BILITOT 0.5   No results for input(s): CHOL in the last 72 hours. No results for input(s): PROTIME in the last 72 hours.  Imaging: Imaging results have been reviewed and X-ray Chest Pa And Lateral  09/17/2015   CLINICAL DATA:  Occasional chest tightness.  EXAM: CHEST  2 VIEW  COMPARISON:  Radiograph 05/31/2008   FINDINGS: Normal mediastinum and cardiac silhouette. Normal pulmonary vasculature. No evidence of effusion, infiltrate, or pneumothorax. No acute bony abnormality.  IMPRESSION: No acute cardiopulmonary process.   Electronically Signed   By: Genevive Bi M.D.   On: 09/17/2015 08:40    Cardiac Studies:  Assessment/Plan:  Unstable angina MI ruled out Severe three-vessel coronary artery disease with markedly depressed LV systolic function History of silent MI in the past Ischemic cardiomyopathy Diabetes mellitus Hypercholesteremia Tobacco abuse Strong family history of coronary artery disease Plan Add low-dose beta blockers and statins as per orders Continue IV heparin Scheduled for CABG on Monday  LOS: 1 day    Rinaldo Cloud 09/17/2015, 11:35 AM

## 2015-09-17 NOTE — Progress Notes (Signed)
ANTICOAGULATION CONSULT NOTE - Follow Up Consult  Pharmacy Consult for Heparin Indication: CAD pending CABG 10/4  No Known Allergies  Patient Measurements: Height:  (157.5 cm) Weight: 153 lb 4.8 oz (69.536 kg) (Scale A) IBW/kg (Calculated) : 54.6 Heparin Dosing Weight: 68  Vital Signs: Temp: 97.5 F (36.4 C) (10/01 1254) Temp Source: Axillary (10/01 1254) BP: 126/75 mmHg (10/01 1254) Pulse Rate: 80 (10/01 1254)  Labs:  Recent Labs  09/16/15 1044 09/16/15 1529 09/16/15 1936 09/17/15 0151 09/17/15 1355  HGB 15.6  --   --  14.1  --   HCT 47.3  --   --  42.3  --   PLT 205  --   --  207  --   LABPROT 13.0  --   --  14.9  --   INR 0.96  --   --  1.15  --   HEPARINUNFRC  --   --   --  <0.10* 0.14*  CREATININE 0.62  --   --  1.01  --   TROPONINI  --  <0.03 <0.03 <0.03  --     Estimated Creatinine Clearance: 68.3 mL/min (by C-G formula based on Cr of 1.01).   Medications:  Scheduled:  . albuterol  2.5 mg Nebulization Once  . aspirin EC  81 mg Oral Daily  . atorvastatin  80 mg Oral q1800  . carvedilol  3.125 mg Oral BID WC  . glimepiride  4 mg Oral BID  . insulin aspart  0-15 Units Subcutaneous TID WC  . multivitamin with minerals  1 tablet Oral Daily  . sodium chloride  3 mL Intravenous Q12H   Infusions:  . heparin 1,100 Units/hr (09/17/15 0355)    Assessment: 59 yo M s/p cath and found to have severe CAD with plans for CABG Monday 10/4.  Continues on heparin with subtherapeutic level.  No bleeding noted.  Will adjust.  Goal of Therapy:  Heparin level 0.3-0.7 units/ml Monitor platelets by anticoagulation protocol: Yes   Plan:  Increase heparin to 1350 units/hr Heparin level 6 hours after rate change Heparin level and CBC daily while on heparin  [employer], Pharm.D., BCPS Clinical Pharmacist Pager [phone] 09/17/2015 3:55 PM

## 2015-09-17 NOTE — Progress Notes (Signed)
ANTICOAGULATION CONSULT NOTE - Follow Up Consult  Pharmacy Consult for Heparin  Indication: CAD awaitng CABG  No Known Allergies  Patient Measurements: Height:  (157.5 cm) Weight: 153 lb 4.8 oz (69.536 kg) (Scale A) IBW/kg (Calculated) : 54.6  Vital Signs: Temp: 97.4 F (36.3 C) (10/01 2048) Temp Source: Oral (10/01 2048) BP: 121/59 mmHg (10/01 2048) Pulse Rate: 77 (10/01 2048)  Labs:  Recent Labs  09/16/15 1044 09/16/15 1529 09/16/15 1936 09/17/15 0151 09/17/15 1355 09/17/15 2202  HGB 15.6  --   --  14.1  --   --   HCT 47.3  --   --  42.3  --   --   PLT 205  --   --  207  --   --   LABPROT 13.0  --   --  14.9  --   --   INR 0.96  --   --  1.15  --   --   HEPARINUNFRC  --   --   --  <0.10* 0.14* 0.28*  CREATININE 0.62  --   --  1.01  --   --   TROPONINI  --  <0.03 <0.03 <0.03  --   --      Assessment: Sub-therapeutic heparin level, no issues per RN.   Goal of Therapy:  Heparin level 0.3-0.7 units/ml Monitor platelets by anticoagulation protocol: Yes   Plan:  -Heparin drip to 1450 units/hr -HL with AM labs  Abran Duke 09/17/2015,11:19 PM

## 2015-09-17 NOTE — Progress Notes (Signed)
ANTICOAGULATION CONSULT NOTE Pharmacy Consult for heparin Indication: chest pain/ACS  No Known Allergies  Patient Measurements: Height:  (157.5 cm) Weight: 154 lb 1.6 oz (69.9 kg) (Bed weight r/t bedrest from femoral cath) IBW/kg (Calculated) : 54.6 Heparin Dosing Weight: 68.7 kg  Vital Signs: Temp: 98.1 F (36.7 C) (10/01 0055) Temp Source: Oral (10/01 0055) BP: 115/62 mmHg (10/01 0055) Pulse Rate: 71 (10/01 0055)  Labs:  Recent Labs  09/16/15 1044 09/16/15 1529 09/16/15 1936 09/17/15 0151  HGB 15.6  --   --  14.1  HCT 47.3  --   --  42.3  PLT 205  --   --  207  LABPROT 13.0  --   --  14.9  INR 0.96  --   --  1.15  HEPARINUNFRC  --   --   --  <0.10*  CREATININE 0.62  --   --  1.01  TROPONINI  --  <0.03 <0.03 <0.03    Estimated Creatinine Clearance: 68.4 mL/min (by C-G formula based on Cr of 1.01).  Assessment: 59 yo male with CAD awaiting CABG for heparin  Goal of Therapy:  Heparin level 0.3-0.7 units/ml Monitor platelets by anticoagulation protocol: Yes   Plan:  Increase Heparin 1100 units/hr Check heparin level in 8 hours.   Geannie Risen, PharmD, BCPS   09/17/2015,3:33 AM

## 2015-09-17 NOTE — Progress Notes (Signed)
CARDIAC REHAB PHASE I  [phone] Patient eating lunch. Wife at bedside. Patient stated he has decided to precede with heart surgery on Monday. Education book given and videos encouraged. Patient and wife full of questions and receptive to education. IS given and use encouraged. Patient able to pull 2000-2500 consistently. Patient also encouraged to ambulate in hallway. Reviewed proper sternal precautions with patient as it relates to standing, sitting, and coughing. Patient returned demonstration. Will follow up with patient post surgery. Also encouraged patient to speak with surgeon regarding recent mouth, gum laser surgery for infections and gum disease prior to surgery on Monday.   Maude Leriche, BSN 09/17/2015 2:42 PM

## 2015-09-17 NOTE — Progress Notes (Addendum)
Pre-op Cardiac Surgery  Carotid Findings:  1-39% ICA stenosis.  Vertebral artery flow is antegrade.   Upper Extremity Right Left  Brachial Pressures 124T 121T  Radial Waveforms T T  Ulnar Waveforms T T  Palmar Arch (Allen's Test) Doppler signal remains normal with radial compression and obliterates with ulnar compression Doppler signal remains normal with radial compression and obliterates with ulnar compression   Findings:      Lower  Extremity Right Left  Dorsalis Pedis    Anterior Tibial 103B 104B  Posterior Tibial 95B 101B  Ankle/Brachial Indices 0.83 0.84    Findings:  ABIs indicate mild reduction in arterial blood flow to the bilateral lower extremities.

## 2015-09-18 DIAGNOSIS — I25110 Atherosclerotic heart disease of native coronary artery with unstable angina pectoris: Secondary | ICD-10-CM

## 2015-09-18 LAB — SURGICAL PCR SCREEN
MRSA, PCR: NEGATIVE
Staphylococcus aureus: NEGATIVE

## 2015-09-18 LAB — COMPREHENSIVE METABOLIC PANEL
ALT: 11 U/L — ABNORMAL LOW (ref 17–63)
AST: 12 U/L — ABNORMAL LOW (ref 15–41)
Albumin: 3.7 g/dL (ref 3.5–5.0)
Alkaline Phosphatase: 48 U/L (ref 38–126)
Anion gap: 8 (ref 5–15)
BUN: 16 mg/dL (ref 6–20)
CO2: 27 mmol/L (ref 22–32)
Calcium: 9.2 mg/dL (ref 8.9–10.3)
Chloride: 102 mmol/L (ref 101–111)
Creatinine, Ser: 0.54 mg/dL — ABNORMAL LOW (ref 0.61–1.24)
GFR calc Af Amer: >60 mL/min (ref >60)
GFR calc non Af Amer: >60 mL/min (ref >60)
Glucose, Bld: 203 mg/dL — ABNORMAL HIGH (ref 65–99)
Potassium: 3.8 mmol/L (ref 3.5–5.1)
Sodium: 137 mmol/L (ref 135–145)
Total Bilirubin: 0.6 mg/dL (ref 0.3–1.2)
Total Protein: 6.3 g/dL — ABNORMAL LOW (ref 6.5–8.1)

## 2015-09-18 LAB — CBC
HEMATOCRIT: 41.6 % (ref 39.0–52.0)
HEMOGLOBIN: 13.8 g/dL (ref 13.0–17.0)
MCH: 27.9 pg (ref 26.0–34.0)
MCHC: 33.2 g/dL (ref 30.0–36.0)
MCV: 84.2 fL (ref 78.0–100.0)
Platelets: 188 10*3/uL (ref 150–400)
RBC: 4.94 MIL/uL (ref 4.22–5.81)
RDW: 13.3 % (ref 11.5–15.5)
WBC: 5.2 10*3/uL (ref 4.0–10.5)

## 2015-09-18 LAB — TYPE AND SCREEN
ABO/RH(D): A POS
Antibody Screen: NEGATIVE

## 2015-09-18 LAB — GLUCOSE, CAPILLARY
GLUCOSE-CAPILLARY: 293 mg/dL — AB (ref 65–99)
Glucose-Capillary: 194 mg/dL — ABNORMAL HIGH (ref 65–99)
Glucose-Capillary: 288 mg/dL — ABNORMAL HIGH (ref 65–99)
Glucose-Capillary: 195 mg/dL — ABNORMAL HIGH (ref 65–99)

## 2015-09-18 LAB — HEPARIN LEVEL (UNFRACTIONATED): Heparin Unfractionated: 0.31 IU/mL (ref 0.30–0.70)

## 2015-09-18 LAB — ABO/RH: ABO/RH(D): A POS

## 2015-09-18 MED ORDER — POTASSIUM CHLORIDE 2 MEQ/ML IV SOLN
80.0000 meq | INTRAVENOUS | Status: DC
  Filled 2015-09-18: qty 40

## 2015-09-18 MED ORDER — SODIUM CHLORIDE 0.9 % IV SOLN
INTRAVENOUS | Status: DC
  Filled 2015-09-18: qty 30

## 2015-09-18 MED ORDER — DEXTROSE 5 % IV SOLN
750.0000 mg | INTRAVENOUS | Status: DC
  Filled 2015-09-18: qty 750

## 2015-09-18 MED ORDER — METOPROLOL TARTRATE 12.5 MG HALF TABLET
12.5000 mg | ORAL_TABLET | ORAL | Status: AC
  Administered 2015-09-19: 12.5 mg via ORAL
  Filled 2015-09-18: qty 1

## 2015-09-18 MED ORDER — DEXMEDETOMIDINE HCL IN NACL 400 MCG/100ML IV SOLN
0.1000 ug/kg/h | INTRAVENOUS | Status: AC
  Administered 2015-09-19: .7 ug/kg/h via INTRAVENOUS
  Filled 2015-09-18: qty 100

## 2015-09-18 MED ORDER — PLASMA-LYTE 148 IV SOLN
INTRAVENOUS | Status: DC
  Filled 2015-09-18: qty 2.5

## 2015-09-18 MED ORDER — CHLORHEXIDINE GLUCONATE 0.12 % MT SOLN
15.0000 mL | Freq: Once | OROMUCOSAL | Status: AC
  Administered 2015-09-19: 15 mL via OROMUCOSAL
  Filled 2015-09-18: qty 15

## 2015-09-18 MED ORDER — DOPAMINE-DEXTROSE 3.2-5 MG/ML-% IV SOLN
0.0000 ug/kg/min | INTRAVENOUS | Status: AC
  Administered 2015-09-19: 3 ug/kg/min via INTRAVENOUS
  Filled 2015-09-18: qty 250

## 2015-09-18 MED ORDER — DEXTROSE 5 % IV SOLN
1.5000 g | INTRAVENOUS | Status: AC
  Administered 2015-09-19: .75 g via INTRAVENOUS
  Administered 2015-09-19: 1.5 g via INTRAVENOUS
  Filled 2015-09-18: qty 1.5

## 2015-09-18 MED ORDER — DOXYCYCLINE HYCLATE 100 MG PO TABS
100.0000 mg | ORAL_TABLET | Freq: Two times a day (BID) | ORAL | Status: DC
  Administered 2015-09-18 (×2): 100 mg via ORAL
  Filled 2015-09-18 (×3): qty 1

## 2015-09-18 MED ORDER — TEMAZEPAM 15 MG PO CAPS: 15.0000 mg | ORAL_CAPSULE | Freq: Once | ORAL | Status: DC | PRN

## 2015-09-18 MED ORDER — CHLORHEXIDINE GLUCONATE 4 % EX LIQD
60.0000 mL | Freq: Once | CUTANEOUS | Status: AC
  Administered 2015-09-18: 4 via TOPICAL
  Filled 2015-09-18: qty 60

## 2015-09-18 MED ORDER — NITROGLYCERIN IN D5W 200-5 MCG/ML-% IV SOLN
2.0000 ug/min | INTRAVENOUS | Status: AC
  Administered 2015-09-19: 5 ug/min via INTRAVENOUS
  Filled 2015-09-18: qty 250

## 2015-09-18 MED ORDER — BISACODYL 5 MG PO TBEC
5.0000 mg | DELAYED_RELEASE_TABLET | Freq: Once | ORAL | Status: AC
  Administered 2015-09-18: 5 mg via ORAL
  Filled 2015-09-18: qty 1

## 2015-09-18 MED ORDER — SODIUM CHLORIDE 0.9 % IV SOLN
INTRAVENOUS | Status: AC
  Administered 2015-09-19: 1.9 [IU]/h via INTRAVENOUS
  Filled 2015-09-18: qty 2.5

## 2015-09-18 MED ORDER — EPINEPHRINE HCL 1 MG/ML IJ SOLN
0.0000 ug/min | INTRAVENOUS | Status: DC
  Filled 2015-09-18: qty 4

## 2015-09-18 MED ORDER — VANCOMYCIN HCL 10 G IV SOLR
1250.0000 mg | INTRAVENOUS | Status: AC
  Administered 2015-09-19: 1250 mg via INTRAVENOUS
  Filled 2015-09-18: qty 1250

## 2015-09-18 MED ORDER — MAGNESIUM SULFATE 50 % IJ SOLN
40.0000 meq | INTRAMUSCULAR | Status: DC
  Filled 2015-09-18: qty 10

## 2015-09-18 MED ORDER — CHLORHEXIDINE GLUCONATE 4 % EX LIQD
60.0000 mL | Freq: Once | CUTANEOUS | Status: AC
  Administered 2015-09-19: 4 via TOPICAL
  Filled 2015-09-18: qty 60

## 2015-09-18 MED ORDER — SODIUM CHLORIDE 0.9 % IV SOLN
INTRAVENOUS | Status: AC
  Administered 2015-09-19: 14 mL/h via INTRAVENOUS
  Administered 2015-09-19: 69 mL/h via INTRAVENOUS
  Filled 2015-09-18: qty 40

## 2015-09-18 MED ORDER — PHENYLEPHRINE HCL 10 MG/ML IJ SOLN
30.0000 ug/min | INTRAVENOUS | Status: AC
  Administered 2015-09-19: 10 ug/min via INTRAVENOUS
  Filled 2015-09-18: qty 2

## 2015-09-18 NOTE — Progress Notes (Signed)
Subjective:  Complains of chronic gum pain. Denies any chest pain or shortness of breath.  Objective:  Vital Signs in the last 24 hours: Temp:  [97.2 F (36.2 C)-97.5 F (36.4 C)] 97.2 F (36.2 C) (10/02 0424) Pulse Rate:  [77-80] 78 (10/02 0424) Resp:  [18-20] 18 (10/02 0424) BP: (116-126)/(48-75) 116/48 mmHg (10/02 0424) SpO2:  [98 %] 98 % (10/02 0424) Weight:  [69.8 kg (153 lb 14.1 oz)] 69.8 kg (153 lb 14.1 oz) (10/02 0424)  Intake/Output from previous day: 10/01 0701 - 10/02 0700 In: 1304.2 [P.O.:1000; I.V.:304.2] Out: 2100 [Urine:2100] Intake/Output from this shift: Total I/O In: 480 [P.O.:480] Out: 400 [Urine:400]  Physical Exam: Neck: no adenopathy, no carotid bruit, no JVD and supple, symmetrical, trachea midline Lungs: clear to auscultation bilaterally Heart: regular rate and rhythm, S1, S2 normal and Soft systolic murmur noted Abdomen: soft, non-tender; bowel sounds normal; no masses,  no organomegaly Extremities: extremities normal, atraumatic, no cyanosis or edema  Lab Results:  Recent Labs  09/17/15 0151 09/18/15 0306  WBC 5.8 5.2  HGB 14.1 13.8  PLT 207 188    Recent Labs  09/17/15 0151 09/18/15 0306  NA 135 137  K 4.0 3.8  CL 101 102  CO2 27 27  GLUCOSE 268* 203*  BUN 15 16  CREATININE 1.01 0.54*    Recent Labs  09/16/15 1936 09/17/15 0151  TROPONINI <0.03 <0.03   Hepatic Function Panel  Recent Labs  09/18/15 0306  PROT 6.3*  ALBUMIN 3.7  AST 12*  ALT 11*  ALKPHOS 48  BILITOT 0.6   No results for input(s): CHOL in the last 72 hours. No results for input(s): PROTIME in the last 72 hours.  Imaging: Imaging results have been reviewed and X-ray Chest Pa And Lateral  09/17/2015   CLINICAL DATA:  Occasional chest tightness.  EXAM: CHEST  2 VIEW  COMPARISON:  Radiograph 05/31/2008  FINDINGS: Normal mediastinum and cardiac silhouette. Normal pulmonary vasculature. No evidence of effusion, infiltrate, or pneumothorax. No acute bony  abnormality.  IMPRESSION: No acute cardiopulmonary process.   Electronically Signed   By: Genevive Bi M.D.   On: 09/17/2015 08:40    Cardiac Studies:  Assessment/Plan:  Unstable angina MI ruled out status post left cath Severe three-vessel coronary artery disease with markedly depressed LV systolic function History of silent MI in the past Ischemic cardiomyopathy Diabetes mellitus Hypercholesteremia Tobacco abuse Strong family history of coronary artery disease Chronic gingivitis Plan Start doxycycline 100 mg twice daily Continue rest of the medications   LOS: 2 days    Dustin Rodriguez 09/18/2015, 11:46 AM

## 2015-09-18 NOTE — Progress Notes (Signed)
      [address]       D[place] [id number]             [phone]     CARDIOTHORACIC SURGERY PROGRESS NOTE  2 Days Post-Op  S/P Procedure(s) (LRB): Right/Left Heart Cath and Coronary Angiography (N/A)  Subjective: Feels okay.  Denies CP or SOB.  Has many questions but currently agreeable w/ plans for surgery  Objective: Vital signs in last 24 hours: Temp:  [97.2 F (36.2 C)-98.3 F (36.8 C)] 98.3 F (36.8 C) (10/02 1234) Pulse Rate:  [68-78] 68 (10/02 1234) Cardiac Rhythm:  [-] Normal sinus rhythm (10/02 0842) Resp:  [18-20] 20 (10/02 1234) BP: (116-127)/(48-59) 127/57 mmHg (10/02 1234) SpO2:  [97 %-98 %] 97 % (10/02 1234) Weight:  [69.8 kg (153 lb 14.1 oz)] 69.8 kg (153 lb 14.1 oz) (10/02 0424)  Physical Exam:  Rhythm:   sinus  Breath sounds: clear  Heart sounds:  RRR  Incisions:  n/a  Abdomen:  soft  Extremities:  warm   Intake/Output from previous day: 10/01 0701 - 10/02 0700 In: 1304.2 [P.O.:1000; I.V.:304.2] Out: 2100 [Urine:2100] Intake/Output this shift: Total I/O In: 480 [P.O.:480] Out: 400 [Urine:400]  Lab Results:  Recent Labs  09/17/15 0151 09/18/15 0306  WBC 5.8 5.2  HGB 14.1 13.8  HCT 42.3 41.6  PLT 207 188   BMET:  Recent Labs  09/17/15 0151 09/18/15 0306  NA 135 137  K 4.0 3.8  CL 101 102  CO2 27 27  GLUCOSE 268* 203*  BUN 15 16  CREATININE 1.01 0.54*  CALCIUM 9.1 9.2    CBG (last 3)   Recent Labs  09/17/15 2116 09/18/15 0545 09/18/15 1114  GLUCAP 284* 194* 288*   PT/INR:   Recent Labs  09/17/15 0151  LABPROT 14.9  INR 1.15    CXR:  CHEST 2 VIEW  COMPARISON: Radiograph 05/31/2008  FINDINGS: Normal mediastinum and cardiac silhouette. Normal pulmonary vasculature. No evidence of effusion, infiltrate, or pneumothorax. No acute bony abnormality.  IMPRESSION: No acute cardiopulmonary process.   Electronically Signed  By: Genevive Bi M.D.  On: 09/17/2015  08:40  Assessment/Plan:  Tentatively for CABG tomorrow.  All questions answered.  I spent in excess of 15 minutes during the conduct of this hospital encounter and >50% of this time involved direct face-to-face encounter with the patient for counseling and/or coordination of their care.   Purcell Nails, MD 09/18/2015 1:27 PM

## 2015-09-18 NOTE — Progress Notes (Signed)
ANTICOAGULATION CONSULT NOTE - Follow Up Consult  Pharmacy Consult for Heparin Indication: CAD pending CABG 10/3  No Known Allergies  Patient Measurements: Height:  (157.5 cm) Weight: 153 lb 14.1 oz (69.8 kg) IBW/kg (Calculated) : 54.6 Heparin Dosing Weight: 68  Vital Signs: Temp: 97.2 F (36.2 C) (10/02 0424) Temp Source: Oral (10/02 0424) BP: 116/48 mmHg (10/02 0424) Pulse Rate: 78 (10/02 0424)  Labs:  Recent Labs  09/16/15 1044 09/16/15 1529 09/16/15 1936  09/17/15 0151 09/17/15 1355 09/17/15 2202 09/18/15 0306  HGB 15.6  --   --   --  14.1  --   --  13.8  HCT 47.3  --   --   --  42.3  --   --  41.6  PLT 205  --   --   --  207  --   --  188  LABPROT 13.0  --   --   --  14.9  --   --   --   INR 0.96  --   --   --  1.15  --   --   --   HEPARINUNFRC  --   --   --   < > <0.10* 0.14* 0.28* 0.31  CREATININE 0.62  --   --   --  1.01  --   --  0.54*  TROPONINI  --  <0.03 <0.03  --  <0.03  --   --   --   < > = values in this interval not displayed.  Estimated Creatinine Clearance: 86.4 mL/min (by C-G formula based on Cr of 0.54).   Medications:  Scheduled:  . albuterol  2.5 mg Nebulization Once  . aspirin EC  81 mg Oral Daily  . atorvastatin  80 mg Oral q1800  . carvedilol  3.125 mg Oral BID WC  . doxycycline  100 mg Oral Q12H  . glimepiride  4 mg Oral BID  . insulin aspart  0-15 Units Subcutaneous TID WC  . multivitamin with minerals  1 tablet Oral Daily  . sodium chloride  3 mL Intravenous Q12H   Infusions:  . heparin 1,450 Units/hr (09/18/15 0008)    Assessment: 58 yo M s/p cath and found to have severe CAD with plans for CABG Monday 10/3.  On heparin now with therapeutic level.  No bleeding noted.  Will increase slightly to mid-goal range.  MD to address stop time prior to surgery.  Goal of Therapy:  Heparin level 0.3-0.7 units/ml Monitor platelets by anticoagulation protocol: Yes   Plan:  Increase heparin to 1500 units/hr Heparin level and CBC  daily while on heparin Follow-up CABG plans for 10/3 and heparin stop time  [employer], Pharm.D., BCPS Clinical Pharmacist Pager [phone] 09/18/2015 12:22 PM

## 2015-09-19 ENCOUNTER — Encounter (HOSPITAL_COMMUNITY)
Admission: RE | Disposition: A | Discharge status: Home / Self Care | Payer: No Typology Code available for payment source | Source: Ambulatory Visit | Attending: Cardiothoracic Surgery

## 2015-09-19 ENCOUNTER — Ambulatory Visit (HOSPITAL_COMMUNITY): Payer: Medicaid Other

## 2015-09-19 ENCOUNTER — Inpatient Hospital Stay (HOSPITAL_COMMUNITY): Payer: Medicaid Other

## 2015-09-19 ENCOUNTER — Ambulatory Visit (HOSPITAL_COMMUNITY): Payer: Medicaid Other | Admitting: Anesthesiology

## 2015-09-19 DIAGNOSIS — I2582 Chronic total occlusion of coronary artery: Secondary | ICD-10-CM | POA: Diagnosis not present

## 2015-09-19 DIAGNOSIS — F17210 Nicotine dependence, cigarettes, uncomplicated: Secondary | ICD-10-CM | POA: Diagnosis present

## 2015-09-19 DIAGNOSIS — Z951 Presence of aortocoronary bypass graft: Secondary | ICD-10-CM

## 2015-09-19 DIAGNOSIS — J9811 Atelectasis: Secondary | ICD-10-CM | POA: Diagnosis not present

## 2015-09-19 DIAGNOSIS — Z823 Family history of stroke: Secondary | ICD-10-CM | POA: Diagnosis not present

## 2015-09-19 DIAGNOSIS — I25110 Atherosclerotic heart disease of native coronary artery with unstable angina pectoris: Secondary | ICD-10-CM | POA: Diagnosis not present

## 2015-09-19 DIAGNOSIS — Z833 Family history of diabetes mellitus: Secondary | ICD-10-CM | POA: Diagnosis not present

## 2015-09-19 DIAGNOSIS — R079 Chest pain, unspecified: Secondary | ICD-10-CM | POA: Diagnosis present

## 2015-09-19 DIAGNOSIS — E8770 Fluid overload, unspecified: Secondary | ICD-10-CM | POA: Diagnosis not present

## 2015-09-19 DIAGNOSIS — I252 Old myocardial infarction: Secondary | ICD-10-CM | POA: Diagnosis not present

## 2015-09-19 DIAGNOSIS — E785 Hyperlipidemia, unspecified: Secondary | ICD-10-CM | POA: Diagnosis present

## 2015-09-19 DIAGNOSIS — F419 Anxiety disorder, unspecified: Secondary | ICD-10-CM | POA: Diagnosis not present

## 2015-09-19 DIAGNOSIS — Z8249 Family history of ischemic heart disease and other diseases of the circulatory system: Secondary | ICD-10-CM | POA: Diagnosis not present

## 2015-09-19 DIAGNOSIS — I420 Dilated cardiomyopathy: Secondary | ICD-10-CM | POA: Diagnosis not present

## 2015-09-19 DIAGNOSIS — I493 Ventricular premature depolarization: Secondary | ICD-10-CM | POA: Diagnosis not present

## 2015-09-19 DIAGNOSIS — K0510 Chronic gingivitis, plaque induced: Secondary | ICD-10-CM | POA: Diagnosis present

## 2015-09-19 DIAGNOSIS — E1165 Type 2 diabetes mellitus with hyperglycemia: Secondary | ICD-10-CM | POA: Diagnosis present

## 2015-09-19 DIAGNOSIS — Y846 Urinary catheterization as the cause of abnormal reaction of the patient, or of later complication, without mention of misadventure at the time of the procedure: Secondary | ICD-10-CM | POA: Diagnosis not present

## 2015-09-19 DIAGNOSIS — T8384XA Pain from genitourinary prosthetic devices, implants and grafts, initial encounter: Secondary | ICD-10-CM | POA: Diagnosis not present

## 2015-09-19 DIAGNOSIS — Z7982 Long term (current) use of aspirin: Secondary | ICD-10-CM | POA: Diagnosis not present

## 2015-09-19 DIAGNOSIS — D62 Acute posthemorrhagic anemia: Secondary | ICD-10-CM | POA: Diagnosis not present

## 2015-09-19 HISTORY — PX: TEE WITHOUT CARDIOVERSION: SHX5443

## 2015-09-19 HISTORY — PX: CORONARY ARTERY BYPASS GRAFT: SHX141

## 2015-09-19 LAB — POCT I-STAT, CHEM 8
BUN: 15 mg/dL (ref 6–20)
BUN: 12 mg/dL (ref 6–20)
BUN: 14 mg/dL (ref 6–20)
BUN: 13 mg/dL (ref 6–20)
BUN: 14 mg/dL (ref 6–20)
BUN: 13 mg/dL (ref 6–20)
BUN: 9 mg/dL (ref 6–20)
CALCIUM ION: 1.28 mmol/L — AB (ref 1.12–1.23)
CALCIUM ION: 1.25 mmol/L — AB (ref 1.12–1.23)
CALCIUM ION: 1.16 mmol/L (ref 1.12–1.23)
CALCIUM ION: 1.24 mmol/L — AB (ref 1.12–1.23)
CHLORIDE: 103 mmol/L (ref 101–111)
CHLORIDE: 102 mmol/L (ref 101–111)
CREATININE: 0.2 mg/dL — AB (ref 0.61–1.24)
CREATININE: 0.4 mg/dL — AB (ref 0.61–1.24)
Calcium, Ion: 1.05 mmol/L — ABNORMAL LOW (ref 1.12–1.23)
Calcium, Ion: 1.15 mmol/L (ref 1.12–1.23)
Calcium, Ion: 1.2 mmol/L (ref 1.12–1.23)
Chloride: 100 mmol/L — ABNORMAL LOW (ref 101–111)
Chloride: 102 mmol/L (ref 101–111)
Chloride: 100 mmol/L — ABNORMAL LOW (ref 101–111)
Chloride: 102 mmol/L (ref 101–111)
Chloride: 101 mmol/L (ref 101–111)
Creatinine, Ser: 0.4 mg/dL — ABNORMAL LOW (ref 0.61–1.24)
Creatinine, Ser: 0.4 mg/dL — ABNORMAL LOW (ref 0.61–1.24)
Creatinine, Ser: 0.4 mg/dL — ABNORMAL LOW (ref 0.61–1.24)
Creatinine, Ser: 0.5 mg/dL — ABNORMAL LOW (ref 0.61–1.24)
Creatinine, Ser: 0.5 mg/dL — ABNORMAL LOW (ref 0.61–1.24)
GLUCOSE: 157 mg/dL — AB (ref 65–99)
GLUCOSE: 88 mg/dL (ref 65–99)
GLUCOSE: 177 mg/dL — AB (ref 65–99)
GLUCOSE: 163 mg/dL — AB (ref 65–99)
Glucose, Bld: 94 mg/dL (ref 65–99)
Glucose, Bld: 285 mg/dL — ABNORMAL HIGH (ref 65–99)
Glucose, Bld: 255 mg/dL — ABNORMAL HIGH (ref 65–99)
HCT: 38 % — ABNORMAL LOW (ref 39.0–52.0)
HCT: 29 % — ABNORMAL LOW (ref 39.0–52.0)
HCT: 30 % — ABNORMAL LOW (ref 39.0–52.0)
HCT: 34 % — ABNORMAL LOW (ref 39.0–52.0)
HEMATOCRIT: 30 % — AB (ref 39.0–52.0)
HEMATOCRIT: 36 % — AB (ref 39.0–52.0)
HEMATOCRIT: 32 % — AB (ref 39.0–52.0)
HEMOGLOBIN: 10.2 g/dL — AB (ref 13.0–17.0)
HEMOGLOBIN: 12.2 g/dL — AB (ref 13.0–17.0)
HEMOGLOBIN: 9.9 g/dL — AB (ref 13.0–17.0)
HEMOGLOBIN: 10.2 g/dL — AB (ref 13.0–17.0)
HEMOGLOBIN: 10.9 g/dL — AB (ref 13.0–17.0)
HEMOGLOBIN: 11.6 g/dL — AB (ref 13.0–17.0)
Hemoglobin: 12.9 g/dL — ABNORMAL LOW (ref 13.0–17.0)
POTASSIUM: 3.6 mmol/L (ref 3.5–5.1)
POTASSIUM: 4 mmol/L (ref 3.5–5.1)
Potassium: 3.6 mmol/L (ref 3.5–5.1)
Potassium: 3.8 mmol/L (ref 3.5–5.1)
Potassium: 4.4 mmol/L (ref 3.5–5.1)
Potassium: 3.8 mmol/L (ref 3.5–5.1)
Potassium: 3.9 mmol/L (ref 3.5–5.1)
SODIUM: 139 mmol/L (ref 135–145)
SODIUM: 139 mmol/L (ref 135–145)
SODIUM: 136 mmol/L (ref 135–145)
SODIUM: 138 mmol/L (ref 135–145)
Sodium: 136 mmol/L (ref 135–145)
Sodium: 138 mmol/L (ref 135–145)
Sodium: 137 mmol/L (ref 135–145)
TCO2: 28 mmol/L (ref 0–100)
TCO2: 26 mmol/L (ref 0–100)
TCO2: 27 mmol/L (ref 0–100)
TCO2: 25 mmol/L (ref 0–100)
TCO2: 26 mmol/L (ref 0–100)
TCO2: 24 mmol/L (ref 0–100)
TCO2: 22 mmol/L (ref 0–100)

## 2015-09-19 LAB — CBC
HCT: 42.7 % (ref 39.0–52.0)
HCT: 34.1 % — ABNORMAL LOW (ref 39.0–52.0)
HEMATOCRIT: 34.4 % — AB (ref 39.0–52.0)
Hemoglobin: 14.1 g/dL (ref 13.0–17.0)
Hemoglobin: 11.5 g/dL — ABNORMAL LOW (ref 13.0–17.0)
Hemoglobin: 11.5 g/dL — ABNORMAL LOW (ref 13.0–17.0)
MCH: 27.8 pg (ref 26.0–34.0)
MCH: 28 pg (ref 26.0–34.0)
MCH: 28 pg (ref 26.0–34.0)
MCHC: 33 g/dL (ref 30.0–36.0)
MCHC: 33.4 g/dL (ref 30.0–36.0)
MCHC: 33.7 g/dL (ref 30.0–36.0)
MCV: 84.2 fL (ref 78.0–100.0)
MCV: 83.9 fL (ref 78.0–100.0)
MCV: 83.2 fL (ref 78.0–100.0)
PLATELETS: 200 10*3/uL (ref 150–400)
Platelets: 127 10*3/uL — ABNORMAL LOW (ref 150–400)
Platelets: 152 10*3/uL (ref 150–400)
RBC: 5.07 MIL/uL (ref 4.22–5.81)
RBC: 4.1 MIL/uL — ABNORMAL LOW (ref 4.22–5.81)
RBC: 4.1 MIL/uL — ABNORMAL LOW (ref 4.22–5.81)
RDW: 13.3 % (ref 11.5–15.5)
RDW: 13.1 % (ref 11.5–15.5)
RDW: 13 % (ref 11.5–15.5)
WBC: 5.3 10*3/uL (ref 4.0–10.5)
WBC: 11.2 10*3/uL — ABNORMAL HIGH (ref 4.0–10.5)
WBC: 15.5 10*3/uL — ABNORMAL HIGH (ref 4.0–10.5)

## 2015-09-19 LAB — APTT: APTT: 29 s (ref 24–37)

## 2015-09-19 LAB — POCT I-STAT 3, ART BLOOD GAS (G3+)
ACID-BASE DEFICIT: 2 mmol/L (ref 0.0–2.0)
ACID-BASE EXCESS: 1 mmol/L (ref 0.0–2.0)
BICARBONATE: 25.9 meq/L — AB (ref 20.0–24.0)
BICARBONATE: 25.4 meq/L — AB (ref 20.0–24.0)
BICARBONATE: 25.1 meq/L — AB (ref 20.0–24.0)
Bicarbonate: 25.1 mEq/L — ABNORMAL HIGH (ref 20.0–24.0)
O2 Saturation: 100 %
O2 Saturation: 90 %
O2 Saturation: 96 %
O2 Saturation: 93 %
PH ART: 7.404 (ref 7.350–7.450)
PH ART: 7.281 — AB (ref 7.350–7.450)
PH ART: 7.368 (ref 7.350–7.450)
TCO2: 27 mmol/L (ref 0–100)
TCO2: 27 mmol/L (ref 0–100)
TCO2: 26 mmol/L (ref 0–100)
TCO2: 26 mmol/L (ref 0–100)
pCO2 arterial: 41.4 mmHg (ref 35.0–45.0)
pCO2 arterial: 53.7 mmHg — ABNORMAL HIGH (ref 35.0–45.0)
pCO2 arterial: 44.7 mmHg (ref 35.0–45.0)
pCO2 arterial: 43.8 mmHg (ref 35.0–45.0)
pH, Arterial: 7.361 (ref 7.350–7.450)
pO2, Arterial: 375 mmHg — ABNORMAL HIGH (ref 80.0–100.0)
pO2, Arterial: 65 mmHg — ABNORMAL LOW (ref 80.0–100.0)
pO2, Arterial: 86 mmHg (ref 80.0–100.0)
pO2, Arterial: 73 mmHg — ABNORMAL LOW (ref 80.0–100.0)

## 2015-09-19 LAB — POCT I-STAT 4, (NA,K, GLUC, HGB,HCT)
GLUCOSE: 171 mg/dL — AB (ref 65–99)
HEMATOCRIT: 35 % — AB (ref 39.0–52.0)
Hemoglobin: 11.9 g/dL — ABNORMAL LOW (ref 13.0–17.0)
Potassium: 3.8 mmol/L (ref 3.5–5.1)
Sodium: 139 mmol/L (ref 135–145)

## 2015-09-19 LAB — GLUCOSE, CAPILLARY
GLUCOSE-CAPILLARY: 117 mg/dL — AB (ref 65–99)
Glucose-Capillary: 247 mg/dL — ABNORMAL HIGH (ref 65–99)
Glucose-Capillary: 119 mg/dL — ABNORMAL HIGH (ref 65–99)
Glucose-Capillary: 120 mg/dL — ABNORMAL HIGH (ref 65–99)
Glucose-Capillary: 122 mg/dL — ABNORMAL HIGH (ref 65–99)
Glucose-Capillary: 172 mg/dL — ABNORMAL HIGH (ref 65–99)

## 2015-09-19 LAB — HEMOGLOBIN AND HEMATOCRIT, BLOOD
HCT: 29.5 % — ABNORMAL LOW (ref 39.0–52.0)
Hemoglobin: 10 g/dL — ABNORMAL LOW (ref 13.0–17.0)

## 2015-09-19 LAB — CREATININE, SERUM
Creatinine, Ser: 0.58 mg/dL — ABNORMAL LOW (ref 0.61–1.24)
GFR calc Af Amer: >60 mL/min (ref >60)
GFR calc non Af Amer: >60 mL/min (ref >60)

## 2015-09-19 LAB — HEPARIN LEVEL (UNFRACTIONATED): HEPARIN UNFRACTIONATED: 0.26 [IU]/mL — AB (ref 0.30–0.70)

## 2015-09-19 LAB — MAGNESIUM: Magnesium: 2.6 mg/dL — ABNORMAL HIGH (ref 1.7–2.4)

## 2015-09-19 LAB — PLATELET COUNT: Platelets: 147 10*3/uL — ABNORMAL LOW (ref 150–400)

## 2015-09-19 LAB — PROTIME-INR
INR: 1.38 (ref 0.00–1.49)
Prothrombin Time: 17.1 seconds — ABNORMAL HIGH (ref 11.6–15.2)

## 2015-09-19 LAB — HEMOGLOBIN A1C
Hgb A1c MFr Bld: 9.9 % — ABNORMAL HIGH (ref 4.8–5.6)
Mean Plasma Glucose: 237 mg/dL

## 2015-09-19 SURGERY — CORONARY ARTERY BYPASS GRAFTING (CABG)
Anesthesia: General | Site: Chest

## 2015-09-19 MED ORDER — MIDAZOLAM HCL 5 MG/ML IJ SOLN
INTRAMUSCULAR | Status: DC | PRN
  Administered 2015-09-19: 6 mg via INTRAVENOUS
  Administered 2015-09-19: 4 mg via INTRAVENOUS

## 2015-09-19 MED ORDER — PLASMA-LYTE 148 IV SOLN
INTRAVENOUS | Status: DC | PRN
  Administered 2015-09-19: 500 mL via INTRAVASCULAR

## 2015-09-19 MED ORDER — HEPARIN SODIUM (PORCINE) 1000 UNIT/ML IJ SOLN
INTRAMUSCULAR | Status: AC
  Filled 2015-09-19: qty 1

## 2015-09-19 MED ORDER — SODIUM CHLORIDE 0.9 % IJ SOLN
3.0000 mL | Freq: Two times a day (BID) | INTRAMUSCULAR | Status: DC
  Administered 2015-09-20 – 2015-09-21 (×4): 3 mL via INTRAVENOUS

## 2015-09-19 MED ORDER — MILRINONE IN DEXTROSE 20 MG/100ML IV SOLN
0.2500 ug/kg/min | INTRAVENOUS | Status: AC
  Administered 2015-09-19: .3 ug/kg/min via INTRAVENOUS
  Filled 2015-09-19: qty 100

## 2015-09-19 MED ORDER — SODIUM CHLORIDE 0.9 % IJ SOLN: 3.0000 mL | INTRAMUSCULAR | Status: DC | PRN

## 2015-09-19 MED ORDER — ACETAMINOPHEN 650 MG RE SUPP
650.0000 mg | Freq: Once | RECTAL | Status: AC
  Administered 2015-09-19: 650 mg via RECTAL

## 2015-09-19 MED ORDER — DEXMEDETOMIDINE HCL IN NACL 200 MCG/50ML IV SOLN
0.0000 ug/kg/h | INTRAVENOUS | Status: DC
  Administered 2015-09-19: 0.4 ug/kg/h via INTRAVENOUS
  Filled 2015-09-19: qty 50

## 2015-09-19 MED ORDER — SODIUM CHLORIDE 0.9 % IV SOLN
INTRAVENOUS | Status: DC
  Administered 2015-09-19: 1.7 [IU]/h via INTRAVENOUS
  Filled 2015-09-19: qty 2.5

## 2015-09-19 MED ORDER — ONDANSETRON HCL 4 MG/2ML IJ SOLN
4.0000 mg | Freq: Four times a day (QID) | INTRAMUSCULAR | Status: DC | PRN
  Administered 2015-09-21: 4 mg via INTRAVENOUS
  Filled 2015-09-19: qty 2

## 2015-09-19 MED ORDER — PROPOFOL 10 MG/ML IV BOLUS
INTRAVENOUS | Status: AC
  Filled 2015-09-19: qty 20

## 2015-09-19 MED ORDER — FENTANYL CITRATE (PF) 250 MCG/5ML IJ SOLN
INTRAMUSCULAR | Status: AC
  Filled 2015-09-19: qty 5

## 2015-09-19 MED ORDER — PHENYLEPHRINE HCL 10 MG/ML IJ SOLN
0.0000 ug/min | INTRAVENOUS | Status: DC
  Filled 2015-09-19: qty 2

## 2015-09-19 MED ORDER — METOPROLOL TARTRATE 12.5 MG HALF TABLET
12.5000 mg | ORAL_TABLET | Freq: Two times a day (BID) | ORAL | Status: DC
  Administered 2015-09-20 – 2015-09-21 (×4): 12.5 mg via ORAL
  Filled 2015-09-19 (×7): qty 1

## 2015-09-19 MED ORDER — PROTAMINE SULFATE 10 MG/ML IV SOLN
INTRAVENOUS | Status: AC
  Filled 2015-09-19: qty 25

## 2015-09-19 MED ORDER — ASPIRIN EC 325 MG PO TBEC
325.0000 mg | DELAYED_RELEASE_TABLET | Freq: Every day | ORAL | Status: DC
  Administered 2015-09-20 – 2015-09-21 (×2): 325 mg via ORAL
  Filled 2015-09-19 (×3): qty 1

## 2015-09-19 MED ORDER — HEMOSTATIC AGENTS (NO CHARGE) OPTIME
TOPICAL | Status: DC | PRN
  Administered 2015-09-19 (×3): 1 via TOPICAL

## 2015-09-19 MED ORDER — SODIUM CHLORIDE 0.45 % IV SOLN
INTRAVENOUS | Status: DC | PRN
  Administered 2015-09-19: 16:00:00 via INTRAVENOUS

## 2015-09-19 MED ORDER — SODIUM CHLORIDE 0.9 % IV SOLN: 250.0000 mL | INTRAVENOUS | Status: DC

## 2015-09-19 MED ORDER — ARTIFICIAL TEARS OP OINT
TOPICAL_OINTMENT | OPHTHALMIC | Status: AC
  Filled 2015-09-19: qty 3.5

## 2015-09-19 MED ORDER — MIDAZOLAM HCL 10 MG/2ML IJ SOLN
INTRAMUSCULAR | Status: AC
  Filled 2015-09-19: qty 4

## 2015-09-19 MED ORDER — SODIUM CHLORIDE 0.9 % IV SOLN
INTRAVENOUS | Status: DC
  Administered 2015-09-19: 16:00:00 via INTRAVENOUS

## 2015-09-19 MED ORDER — SODIUM CHLORIDE 0.9 % IV SOLN
1.0000 g/h | INTRAVENOUS | Status: DC
  Filled 2015-09-19: qty 20

## 2015-09-19 MED ORDER — OXYCODONE HCL 5 MG PO TABS
5.0000 mg | ORAL_TABLET | ORAL | Status: DC | PRN
  Administered 2015-09-19: 5 mg via ORAL
  Administered 2015-09-20 – 2015-09-21 (×6): 10 mg via ORAL
  Filled 2015-09-19 (×4): qty 2
  Filled 2015-09-19: qty 1
  Filled 2015-09-19 (×3): qty 2

## 2015-09-19 MED ORDER — DOPAMINE-DEXTROSE 3.2-5 MG/ML-% IV SOLN: 3.0000 ug/kg/min | INTRAVENOUS | Status: AC

## 2015-09-19 MED ORDER — LACTATED RINGERS IV SOLN: INTRAVENOUS | Status: DC

## 2015-09-19 MED ORDER — MAGNESIUM SULFATE 4 GM/100ML IV SOLN
4.0000 g | Freq: Once | INTRAVENOUS | Status: AC
  Administered 2015-09-19: 4 g via INTRAVENOUS
  Filled 2015-09-19: qty 100

## 2015-09-19 MED ORDER — BISACODYL 10 MG RE SUPP: 10.0000 mg | Freq: Every day | RECTAL | Status: DC

## 2015-09-19 MED ORDER — FAMOTIDINE IN NACL 20-0.9 MG/50ML-% IV SOLN
20.0000 mg | Freq: Two times a day (BID) | INTRAVENOUS | Status: AC
  Administered 2015-09-19: 20 mg via INTRAVENOUS

## 2015-09-19 MED ORDER — ALBUMIN HUMAN 5 % IV SOLN
INTRAVENOUS | Status: DC | PRN
  Administered 2015-09-19 (×2): via INTRAVENOUS

## 2015-09-19 MED ORDER — MILRINONE IN DEXTROSE 20 MG/100ML IV SOLN
0.1250 ug/kg/min | INTRAVENOUS | Status: AC
  Administered 2015-09-19: 0.3 ug/kg/min via INTRAVENOUS
  Filled 2015-09-19: qty 100

## 2015-09-19 MED ORDER — VANCOMYCIN HCL IN DEXTROSE 1-5 GM/200ML-% IV SOLN
1000.0000 mg | Freq: Once | INTRAVENOUS | Status: AC
  Administered 2015-09-19: 1000 mg via INTRAVENOUS
  Filled 2015-09-19: qty 200

## 2015-09-19 MED ORDER — ACETAMINOPHEN 160 MG/5ML PO SOLN: 1000.0000 mg | Freq: Four times a day (QID) | ORAL | Status: DC

## 2015-09-19 MED ORDER — FENTANYL CITRATE (PF) 250 MCG/5ML IJ SOLN
INTRAMUSCULAR | Status: DC | PRN
  Administered 2015-09-19: 50 ug via INTRAVENOUS
  Administered 2015-09-19: 250 ug via INTRAVENOUS
  Administered 2015-09-19: 500 ug via INTRAVENOUS
  Administered 2015-09-19: 250 ug via INTRAVENOUS
  Administered 2015-09-19: 200 ug via INTRAVENOUS

## 2015-09-19 MED ORDER — ACETAMINOPHEN 500 MG PO TABS
1000.0000 mg | ORAL_TABLET | Freq: Four times a day (QID) | ORAL | Status: DC
  Administered 2015-09-19 – 2015-09-22 (×10): 1000 mg via ORAL
  Filled 2015-09-19 (×14): qty 2

## 2015-09-19 MED ORDER — MORPHINE SULFATE (PF) 2 MG/ML IV SOLN: 2.0000 mg | INTRAVENOUS | Status: DC | PRN

## 2015-09-19 MED ORDER — TRAMADOL HCL 50 MG PO TABS
50.0000 mg | ORAL_TABLET | ORAL | Status: DC | PRN
  Administered 2015-09-20 (×2): 100 mg via ORAL
  Filled 2015-09-19 (×2): qty 2

## 2015-09-19 MED ORDER — METOPROLOL TARTRATE 1 MG/ML IV SOLN
2.5000 mg | INTRAVENOUS | Status: DC | PRN
  Administered 2015-09-20 (×2): 2.5 mg via INTRAVENOUS

## 2015-09-19 MED ORDER — METOPROLOL TARTRATE 25 MG/10 ML ORAL SUSPENSION
12.5000 mg | Freq: Two times a day (BID) | ORAL | Status: DC
  Filled 2015-09-19 (×7): qty 5

## 2015-09-19 MED ORDER — ANTISEPTIC ORAL RINSE SOLUTION (CORINZ)
7.0000 mL | Freq: Four times a day (QID) | OROMUCOSAL | Status: DC
Start: 2015-09-20 — End: 2015-09-21
  Administered 2015-09-19 – 2015-09-21 (×5): 7 mL via OROMUCOSAL

## 2015-09-19 MED ORDER — INSULIN REGULAR BOLUS VIA INFUSION
0.0000 [IU] | Freq: Three times a day (TID) | INTRAVENOUS | Status: DC
  Filled 2015-09-19: qty 10

## 2015-09-19 MED ORDER — PANTOPRAZOLE SODIUM 40 MG PO TBEC
40.0000 mg | DELAYED_RELEASE_TABLET | Freq: Every day | ORAL | Status: DC
Start: 2015-09-21 — End: 2015-09-22
  Administered 2015-09-21: 40 mg via ORAL
  Filled 2015-09-19: qty 1

## 2015-09-19 MED ORDER — ROCURONIUM BROMIDE 50 MG/5ML IV SOLN
INTRAVENOUS | Status: AC
  Filled 2015-09-19: qty 1

## 2015-09-19 MED ORDER — METOCLOPRAMIDE HCL 5 MG/ML IJ SOLN
10.0000 mg | Freq: Four times a day (QID) | INTRAMUSCULAR | Status: AC
  Administered 2015-09-19 – 2015-09-20 (×4): 10 mg via INTRAVENOUS
  Filled 2015-09-19 (×4): qty 2

## 2015-09-19 MED ORDER — ASPIRIN 81 MG PO CHEW: 324.0000 mg | CHEWABLE_TABLET | Freq: Every day | ORAL | Status: DC

## 2015-09-19 MED ORDER — LACTATED RINGERS IV SOLN
INTRAVENOUS | Status: DC | PRN
  Administered 2015-09-19 (×5): via INTRAVENOUS

## 2015-09-19 MED ORDER — PHENYLEPHRINE HCL 10 MG/ML IJ SOLN
10.0000 mg | INTRAVENOUS | Status: DC | PRN
  Administered 2015-09-19: 10 ug/min via INTRAVENOUS

## 2015-09-19 MED ORDER — NITROGLYCERIN IN D5W 200-5 MCG/ML-% IV SOLN
0.0000 ug/min | INTRAVENOUS | Status: DC
  Administered 2015-09-19: 3 ug/min via INTRAVENOUS

## 2015-09-19 MED ORDER — BISACODYL 5 MG PO TBEC
10.0000 mg | DELAYED_RELEASE_TABLET | Freq: Every day | ORAL | Status: DC
  Administered 2015-09-20 – 2015-09-21 (×2): 10 mg via ORAL
  Filled 2015-09-19 (×2): qty 2

## 2015-09-19 MED ORDER — ROCURONIUM BROMIDE 100 MG/10ML IV SOLN
INTRAVENOUS | Status: DC | PRN
  Administered 2015-09-19: 50 mg via INTRAVENOUS
  Administered 2015-09-19: 40 mg via INTRAVENOUS
  Administered 2015-09-19 (×3): 50 mg via INTRAVENOUS
  Administered 2015-09-19: 10 mg via INTRAVENOUS

## 2015-09-19 MED ORDER — ROCURONIUM BROMIDE 50 MG/5ML IV SOLN
INTRAVENOUS | Status: AC
  Filled 2015-09-19: qty 3

## 2015-09-19 MED ORDER — LIDOCAINE HCL (CARDIAC) 20 MG/ML IV SOLN
INTRAVENOUS | Status: AC
  Filled 2015-09-19: qty 5

## 2015-09-19 MED ORDER — CHLORHEXIDINE GLUCONATE 0.12% ORAL RINSE (MEDLINE KIT)
15.0000 mL | Freq: Two times a day (BID) | OROMUCOSAL | Status: DC
  Administered 2015-09-19 – 2015-09-20 (×2): 15 mL via OROMUCOSAL

## 2015-09-19 MED ORDER — MORPHINE SULFATE (PF) 2 MG/ML IV SOLN
2.0000 mg | INTRAVENOUS | Status: DC | PRN
  Administered 2015-09-19 – 2015-09-20 (×8): 4 mg via INTRAVENOUS
  Filled 2015-09-19 (×8): qty 2

## 2015-09-19 MED ORDER — PROTAMINE SULFATE 10 MG/ML IV SOLN
INTRAVENOUS | Status: DC | PRN
  Administered 2015-09-19: 10 mg via INTRAVENOUS
  Administered 2015-09-19: 120 mg via INTRAVENOUS
  Administered 2015-09-19: 70 mg via INTRAVENOUS
  Administered 2015-09-19: 50 mg via INTRAVENOUS

## 2015-09-19 MED ORDER — MORPHINE SULFATE (PF) 2 MG/ML IV SOLN
1.0000 mg | INTRAVENOUS | Status: AC | PRN
  Administered 2015-09-19 (×3): 2 mg via INTRAVENOUS
  Filled 2015-09-19 (×3): qty 1

## 2015-09-19 MED ORDER — LACTATED RINGERS IV SOLN
INTRAVENOUS | Status: DC
  Administered 2015-09-21: 05:00:00 via INTRAVENOUS

## 2015-09-19 MED ORDER — LACTATED RINGERS IV SOLN: 500.0000 mL | Freq: Once | INTRAVENOUS | Status: DC | PRN

## 2015-09-19 MED ORDER — 0.9 % SODIUM CHLORIDE (POUR BTL) OPTIME
TOPICAL | Status: DC | PRN
  Administered 2015-09-19: 6000 mL

## 2015-09-19 MED ORDER — HEPARIN SODIUM (PORCINE) 1000 UNIT/ML IJ SOLN
INTRAMUSCULAR | Status: DC | PRN
  Administered 2015-09-19: 30000 [IU] via INTRAVENOUS

## 2015-09-19 MED ORDER — SODIUM CHLORIDE 0.9 % IJ SOLN
INTRAMUSCULAR | Status: AC
  Filled 2015-09-19: qty 10

## 2015-09-19 MED ORDER — EPHEDRINE SULFATE 50 MG/ML IJ SOLN
INTRAMUSCULAR | Status: AC
  Filled 2015-09-19: qty 1

## 2015-09-19 MED ORDER — MIDAZOLAM HCL 2 MG/2ML IJ SOLN: 2.0000 mg | INTRAMUSCULAR | Status: DC | PRN

## 2015-09-19 MED ORDER — ALBUMIN HUMAN 5 % IV SOLN
250.0000 mL | INTRAVENOUS | Status: AC | PRN
  Administered 2015-09-19 (×2): 250 mL via INTRAVENOUS

## 2015-09-19 MED ORDER — DEXTROSE 5 % IV SOLN
1.5000 g | Freq: Two times a day (BID) | INTRAVENOUS | Status: AC
  Administered 2015-09-19 – 2015-09-21 (×4): 1.5 g via INTRAVENOUS
  Filled 2015-09-19 (×5): qty 1.5

## 2015-09-19 MED ORDER — DOCUSATE SODIUM 100 MG PO CAPS
200.0000 mg | ORAL_CAPSULE | Freq: Every day | ORAL | Status: DC
  Administered 2015-09-20 – 2015-09-21 (×2): 200 mg via ORAL
  Filled 2015-09-19 (×2): qty 2

## 2015-09-19 MED ORDER — ACETAMINOPHEN 160 MG/5ML PO SOLN: 650.0000 mg | Freq: Once | ORAL | Status: AC

## 2015-09-19 MED ORDER — POTASSIUM CHLORIDE 10 MEQ/50ML IV SOLN
10.0000 meq | INTRAVENOUS | Status: AC
  Administered 2015-09-19 (×3): 10 meq via INTRAVENOUS

## 2015-09-19 MED FILL — Lidocaine HCl Local Preservative Free (PF) Inj 1%: INTRAMUSCULAR | Qty: 30 | Status: AC

## 2015-09-19 MED FILL — Heparin Sodium (Porcine) 2 Unit/ML in Sodium Chloride 0.9%: INTRAMUSCULAR | Qty: 1500 | Status: AC

## 2015-09-19 SURGICAL SUPPLY — 92 items
ADH SKN CLS APL DERMABOND .7 (GAUZE/BANDAGES/DRESSINGS) ×2
BAG DECANTER FOR FLEXI CONT (MISCELLANEOUS) ×3 IMPLANT
BANDAGE ELASTIC 4 VELCRO ST LF (GAUZE/BANDAGES/DRESSINGS) ×3 IMPLANT
BANDAGE ELASTIC 6 VELCRO ST LF (GAUZE/BANDAGES/DRESSINGS) ×3 IMPLANT
BANDAGE HEMOSTAT MRDH 4X4 STRL (MISCELLANEOUS) IMPLANT
BLADE STERNUM SYSTEM 6 (BLADE) ×3 IMPLANT
BLADE SURG 11 STRL SS (BLADE) ×1 IMPLANT
BNDG GAUZE ELAST 4 BULKY (GAUZE/BANDAGES/DRESSINGS) ×3 IMPLANT
BNDG HEMOSTAT MRDH 4X4 STRL (MISCELLANEOUS) ×3
BOOT SUTURE AID YELLOW STND (SUTURE) ×2 IMPLANT
CANISTER SUCTION 2500CC (MISCELLANEOUS) ×3 IMPLANT
CATH CPB KIT GERHARDT (MISCELLANEOUS) ×3 IMPLANT
CATH THORACIC 28FR (CATHETERS) ×3 IMPLANT
CRADLE DONUT ADULT HEAD (MISCELLANEOUS) ×3 IMPLANT
DERMABOND ADVANCED (GAUZE/BANDAGES/DRESSINGS) ×1
DERMABOND ADVANCED .7 DNX12 (GAUZE/BANDAGES/DRESSINGS) IMPLANT
DRAIN CHANNEL 28F RND 3/8 FF (WOUND CARE) ×3 IMPLANT
DRAPE CARDIOVASCULAR INCISE (DRAPES) ×3
DRAPE SLUSH/WARMER DISC (DRAPES) ×3 IMPLANT
DRAPE SRG 135X102X78XABS (DRAPES) ×2 IMPLANT
DRSG AQUACEL AG ADV 3.5X14 (GAUZE/BANDAGES/DRESSINGS) ×3 IMPLANT
ELECT BLADE 4.0 EZ CLEAN MEGAD (MISCELLANEOUS) ×3
ELECT REM PT RETURN 9FT ADLT (ELECTROSURGICAL) ×6
ELECTRODE BLDE 4.0 EZ CLN MEGD (MISCELLANEOUS) ×2 IMPLANT
ELECTRODE REM PT RTRN 9FT ADLT (ELECTROSURGICAL) ×4 IMPLANT
GAUZE SPONGE 4X4 12PLY STRL (GAUZE/BANDAGES/DRESSINGS) ×6 IMPLANT
GLOVE BIO SURGEON STRL SZ 6.5 (GLOVE) ×17 IMPLANT
GOWN STRL REUS W/ TWL LRG LVL3 (GOWN DISPOSABLE) ×8 IMPLANT
GOWN STRL REUS W/TWL LRG LVL3 (GOWN DISPOSABLE) ×33
HEMOSTAT POWDER SURGIFOAM 1G (HEMOSTASIS) ×9 IMPLANT
HEMOSTAT SURGICEL 2X14 (HEMOSTASIS) ×3 IMPLANT
KIT BASIN OR (CUSTOM PROCEDURE TRAY) ×3 IMPLANT
KIT CATH SUCT 8FR (CATHETERS) ×3 IMPLANT
KIT ROOM TURNOVER OR (KITS) ×3 IMPLANT
KIT SUCTION CATH 14FR (SUCTIONS) ×5 IMPLANT
KIT VASOVIEW W/TROCAR VH 2000 (KITS) ×3 IMPLANT
LEAD PACING MYOCARDI (MISCELLANEOUS) ×7 IMPLANT
MARKER GRAFT CORONARY BYPASS (MISCELLANEOUS) ×9 IMPLANT
NS IRRIG 1000ML POUR BTL (IV SOLUTION) ×15 IMPLANT
PACK OPEN HEART (CUSTOM PROCEDURE TRAY) ×3 IMPLANT
PAD ARMBOARD 7.5X6 YLW CONV (MISCELLANEOUS) ×6 IMPLANT
PAD ELECT DEFIB RADIOL ZOLL (MISCELLANEOUS) ×3 IMPLANT
PENCIL BUTTON HOLSTER BLD 10FT (ELECTRODE) ×3 IMPLANT
PUNCH AORTIC ROT 4.0MM RCL 40 (MISCELLANEOUS) ×2 IMPLANT
SET CARDIOPLEGIA MPS 5001102 (MISCELLANEOUS) ×1 IMPLANT
SOLUTION ANTI FOG 6CC (MISCELLANEOUS) ×2 IMPLANT
SPONGE GAUZE 4X4 12PLY STER LF (GAUZE/BANDAGES/DRESSINGS) ×2 IMPLANT
SPONGE LAP 18X18 X RAY DECT (DISPOSABLE) ×7 IMPLANT
STOPCOCK 4 WAY LG BORE MALE ST (IV SETS) ×2 IMPLANT
SURGIFLO W/THROMBIN 8M KIT (HEMOSTASIS) ×1 IMPLANT
SUT BONE WAX W31G (SUTURE) ×3 IMPLANT
SUT ETHIBOND 2 0 SH (SUTURE) ×12
SUT ETHIBOND 2 0 SH 36X2 (SUTURE) IMPLANT
SUT MNCRL AB 4-0 PS2 18 (SUTURE) ×1 IMPLANT
SUT PROLENE 3 0 SH1 36 (SUTURE) ×4 IMPLANT
SUT PROLENE 4 0 RB 1 (SUTURE) ×3
SUT PROLENE 4 0 TF (SUTURE) ×7 IMPLANT
SUT PROLENE 4-0 RB1 .5 CRCL 36 (SUTURE) ×1 IMPLANT
SUT PROLENE 5 0 C 1 36 (SUTURE) ×2 IMPLANT
SUT PROLENE 6 0 C 1 30 (SUTURE) ×3 IMPLANT
SUT PROLENE 6 0 CC (SUTURE) ×10 IMPLANT
SUT PROLENE 7 0 BV1 MDA (SUTURE) ×7 IMPLANT
SUT PROLENE 8 0 BV175 6 (SUTURE) ×16 IMPLANT
SUT SILK  1 MH (SUTURE) ×3
SUT SILK 1 MH (SUTURE) IMPLANT
SUT SILK 1 TIES 10X30 (SUTURE) ×2 IMPLANT
SUT SILK 2 0 SH CR/8 (SUTURE) ×2 IMPLANT
SUT SILK 2 0 TIES 10X30 (SUTURE) ×2 IMPLANT
SUT SILK 3 0 SH CR/8 (SUTURE) ×1 IMPLANT
SUT SILK 4 0 TIE 10X30 (SUTURE) ×2 IMPLANT
SUT STEEL 6MS V (SUTURE) ×4 IMPLANT
SUT STEEL SZ 6 DBL 3X14 BALL (SUTURE) ×3 IMPLANT
SUT TEM PAC WIRE 2 0 SH (SUTURE) ×4 IMPLANT
SUT VIC AB 1 CTX 18 (SUTURE) ×6 IMPLANT
SUT VIC AB 2-0 CT1 27 (SUTURE) ×3
SUT VIC AB 2-0 CT1 TAPERPNT 27 (SUTURE) ×1 IMPLANT
SUT VIC AB 2-0 CTX 27 (SUTURE) ×2 IMPLANT
SUT VIC AB 3-0 X1 27 (SUTURE) ×2 IMPLANT
SUTURE E-PAK OPEN HEART (SUTURE) ×3 IMPLANT
SYSTEM SAHARA CHEST DRAIN ATS (WOUND CARE) ×3 IMPLANT
TAPE CLOTH SURG 4X10 WHT LF (GAUZE/BANDAGES/DRESSINGS) ×2 IMPLANT
TAPE CLOTH SURG 6X10 WHT LF (GAUZE/BANDAGES/DRESSINGS) ×1 IMPLANT
TAPE PAPER 3X10 WHT MICROPORE (GAUZE/BANDAGES/DRESSINGS) ×1 IMPLANT
TAPE UMBILICAL COTTON 1/8X30 (MISCELLANEOUS) ×2 IMPLANT
TOWEL OR 17X24 6PK STRL BLUE (TOWEL DISPOSABLE) ×6 IMPLANT
TOWEL OR 17X26 10 PK STRL BLUE (TOWEL DISPOSABLE) ×6 IMPLANT
TRAY CATH LUMEN 1 20CM STRL (SET/KITS/TRAYS/PACK) ×1 IMPLANT
TRAY FOLEY IC TEMP SENS 16FR (CATHETERS) ×3 IMPLANT
TUBING ART PRESS 48 MALE/FEM (TUBING) ×2 IMPLANT
TUBING INSUFFLATION (TUBING) ×3 IMPLANT
UNDERPAD 30X30 INCONTINENT (UNDERPADS AND DIAPERS) ×3 IMPLANT
WATER STERILE IRR 1000ML POUR (IV SOLUTION) ×6 IMPLANT

## 2015-09-19 NOTE — OR Nursing (Signed)
1st call made to SICU, providing a patient update.  Plans for transfer to 2S Room 10 postoperatively.

## 2015-09-19 NOTE — Progress Notes (Signed)
[address]       [name] [address]             [phone]                 Day of Surgery Procedure(s) (LRB): CORONARY ARTERY BYPASS GRAFTING (CABG) (N/A) TRANSESOPHAGEAL ECHOCARDIOGRAM (TEE) (N/A)  LOS: 3 days   Subjective: Patient considered recommended since discussed after cath, he has agreed to stay and proceed with cabg  Objective: Vital signs in last 24 hours: Patient Vitals for the past 24 hrs:  BP Temp Temp src Pulse Resp SpO2 Weight  09/19/15 0501 122/60 mmHg 98.3 F (36.8 C) Oral 73 18 97 % 152 lb 11.2 oz (69.264 kg)  09/18/15 2132 121/60 mmHg 98.2 F (36.8 C) Oral 75 18 99 % -  09/18/15 1234 (!) 127/57 mmHg 98.3 F (36.8 C) Oral 68 20 97 % -    Filed Weights   09/17/15 0533 09/18/15 0424 09/19/15 0501  Weight: 153 lb 4.8 oz (69.536 kg) 153 lb 14.1 oz (69.8 kg) 152 lb 11.2 oz (69.264 kg)    Hemodynamic parameters for last 24 hours:    Intake/Output from previous day: 10/02 0701 - 10/03 0700 In: 1576.1 [P.O.:1230; I.V.:346.1] Out: 2775 [Urine:2775] Intake/Output this shift:    Scheduled Meds: . [MAR Hold] albuterol  2.5 mg Nebulization Once  . aminocaproic acid (AMICAR) for OHS   Intravenous To OR  . [MAR Hold] aspirin EC  81 mg Oral Daily  . [MAR Hold] atorvastatin  80 mg Oral q1800  . [MAR Hold] carvedilol  3.125 mg Oral BID WC  . cefUROXime (ZINACEF)  IV  1.5 g Intravenous To OR  . cefUROXime (ZINACEF)  IV  750 mg Intravenous To OR  . dexmedetomidine  0.1-0.7 mcg/kg/hr Intravenous To OR  . DOPamine  0-10 mcg/kg/min Intravenous To OR  . [MAR Hold] doxycycline  100 mg Oral Q12H  . epinephrine  0-10 mcg/min Intravenous To OR  . [MAR Hold] glimepiride  4 mg Oral BID  . heparin-papaverine-plasmalyte irrigation   Irrigation To OR  . heparin 30,000 units/NS 1000 mL solution for CELLSAVER   Other To OR  . [MAR Hold] insulin aspart  0-15 Units Subcutaneous TID WC  . insulin (NOVOLIN-R) infusion   Intravenous To OR  . magnesium  sulfate  40 mEq Other To OR  . [MAR Hold] multivitamin with minerals  1 tablet Oral Daily  . nitroGLYCERIN  2-200 mcg/min Intravenous To OR  . phenylephrine (NEO-SYNEPHRINE) Adult infusion  30-200 mcg/min Intravenous To OR  . potassium chloride  80 mEq Other To OR  . [MAR Hold] sodium chloride  3 mL Intravenous Q12H  . vancomycin  1,250 mg Intravenous To OR   Continuous Infusions: . heparin 1,500 Units/hr (09/18/15 1519)   PRN Meds:.[MAR Hold] sodium chloride, [MAR Hold] acetaminophen, [MAR Hold] albuterol, [MAR Hold] ondansetron (ZOFRAN) IV, [MAR Hold] sodium chloride, temazepam  General appearance: alert and cooperative Neurologic: intact Heart: regular rate and rhythm, S1, S2 normal, no murmur, click, rub or gallop Lungs: clear to auscultation bilaterally Abdomen: soft, non-tender; bowel sounds normal; no masses,  no organomegaly Extremities: extremities normal, atraumatic, no cyanosis or edema  Lab Results: CBC: Recent Labs  09/18/15 0306 09/19/15 0320  WBC 5.2 5.3  HGB 13.8 14.1  HCT 41.6 42.7  PLT 188 200   BMET:  Recent Labs  09/17/15 0151 09/18/15 0306  NA 135 137  K 4.0 3.8  CL 101  102  CO2 27 27  GLUCOSE 268* 203*  BUN 15 16  CREATININE 1.01 0.54*  CALCIUM 9.1 9.2    PT/INR:  Recent Labs  09/17/15 0151  LABPROT 14.9  INR 1.15     Radiology X-ray Chest Pa And Lateral  09/17/2015   CLINICAL DATA:  Occasional chest tightness.  EXAM: CHEST  2 VIEW  COMPARISON:  Radiograph 05/31/2008  FINDINGS: Normal mediastinum and cardiac silhouette. Normal pulmonary vasculature. No evidence of effusion, infiltrate, or pneumothorax. No acute bony abnormality.  IMPRESSION: No acute cardiopulmonary process.   Electronically Signed   By: Genevive Bi M.D.   On: 09/17/2015 08:40    Assessment/Plan: S/P Procedure(s) (LRB): CORONARY ARTERY BYPASS GRAFTING (CABG) (N/A) TRANSESOPHAGEAL ECHOCARDIOGRAM (TEE) (N/A)   Will proceed with planned CABG as discussed with the  patient. He is aware of increased risk due to poor lv function, dm and smoking history. The goals risks and alternatives of the planned surgical procedure CABG  have been discussed with the patient in detail. The risks of the procedure including death, infection, stroke, myocardial infarction, bleeding, blood transfusion have all been discussed specifically.  I have quoted Karl Pock a 8 % of perioperative mortality and a complication rate as high as 40 %. The patient's questions have been answered.Dustin Rodriguez is willing  to proceed with the planned procedure.   [name] 09/19/2015 7:21 AM

## 2015-09-19 NOTE — Transfer of Care (Signed)
Immediate Anesthesia Transfer of Care Note  Patient: TETSUO COPPOLA  Procedure(s) Performed: Procedure(s): CORONARY ARTERY BYPASS GRAFTING (CABG) (N/A) TRANSESOPHAGEAL ECHOCARDIOGRAM (TEE) (N/A)  Patient Location: SICU  Anesthesia Type:General  Level of Consciousness: sedated and Patient remains intubated per anesthesia plan  Airway & Oxygen Therapy: Patient remains intubated per anesthesia plan and Patient placed on Ventilator (see vital sign flow sheet for setting)  Post-op Assessment: Report given to RN and Post -op Vital signs reviewed and stable  Post vital signs: Reviewed and stable  Last Vitals:  BP 107/66 HR 87 SpO2 96% on 50% FiO2 RR 12 (see resp flowsheet for details) T 36.4  Complications: No apparent anesthesia complications

## 2015-09-19 NOTE — Anesthesia Preprocedure Evaluation (Addendum)
Anesthesia Evaluation  Patient identified by MRN, date of birth, ID band Patient awake    Reviewed: Allergy & Precautions, NPO status , Patient's Chart, lab work & pertinent test results  Airway Mallampati: II  TM Distance: >3 FB Neck ROM: Full    Dental  (+) Teeth Intact, Dental Advisory Given   Pulmonary Current Smoker,    breath sounds clear to auscultation       Cardiovascular  Rhythm:Regular Rate:Normal     Neuro/Psych    GI/Hepatic   Endo/Other  diabetes  Renal/GU      Musculoskeletal   Abdominal   Peds  Hematology   Anesthesia Other Findings   Reproductive/Obstetrics                            Anesthesia Physical Anesthesia Plan  ASA: III  Anesthesia Plan: General   Post-op Pain Management:    Induction: Intravenous  Airway Management Planned: Oral ETT  Additional Equipment: Arterial line, CVP, PA Cath and 3D TEE  Intra-op Plan:   Post-operative Plan: Post-operative intubation/ventilation  Informed Consent: I have reviewed the patients History and Physical, chart, labs and discussed the procedure including the risks, benefits and alternatives for the proposed anesthesia with the patient or authorized representative who has indicated his/her understanding and acceptance.   Dental advisory given  Plan Discussed with: CRNA and Anesthesiologist  Anesthesia Plan Comments:        Anesthesia Quick Evaluation

## 2015-09-19 NOTE — Progress Notes (Signed)
  Echocardiogram Echocardiogram Transesophageal  has been performed.  Cathie Beams 09/19/2015, 8:37 AM

## 2015-09-19 NOTE — Anesthesia Postprocedure Evaluation (Signed)
  Anesthesia Post-op Note  Patient: Dustin Rodriguez  Procedure(s) Performed: Procedure(s): CORONARY ARTERY BYPASS GRAFTING (CABG) (N/A) TRANSESOPHAGEAL ECHOCARDIOGRAM (TEE) (N/A)  Patient Location: SICU  Anesthesia Type:General  Level of Consciousness: sedated and Patient remains intubated per anesthesia plan  Airway and Oxygen Therapy: Patient remains intubated per anesthesia plan and Patient placed on Ventilator (see vital sign flow sheet for setting)  Post-op Pain: none  Post-op Assessment: Post-op Vital signs reviewed, Patient's Cardiovascular Status Stable, Respiratory Function Stable, Patent Airway and Pain level controlled              Post-op Vital Signs: stable  Last Vitals:  Filed Vitals:   09/19/15 1730  BP:   Pulse: 83  Temp: 37 C  Resp: 16    Complications: No apparent anesthesia complications

## 2015-09-19 NOTE — Progress Notes (Signed)
Began rapid wean protocol of decreasing rate to 4 and FiO2 to 40%, patient is tolerating it well and RT will continue to monitor at bedside during process.  

## 2015-09-19 NOTE — Progress Notes (Signed)
Following an acceptable blood gas, patient achieved a NIF of -21 and FVC of .9L (double his 8cc) and cuff leak was present.  Patient extubated to 4L nasal cannula and stable. RT will continue to monitor.

## 2015-09-19 NOTE — OR Nursing (Signed)
RN made 2nd call to SICU, providing a patient update.  Staff informed of R femoral arterial line.

## 2015-09-19 NOTE — Brief Op Note (Addendum)
      [address]       [name] [address]             [phone]      09/19/2015  12:24 PM  PATIENT:  Dustin Rodriguez  59 y.o. male  PRE-OPERATIVE DIAGNOSIS:  CAD  POST-OPERATIVE DIAGNOSIS:  CAD  PROCEDURE:  Procedure(s):  CORONARY ARTERY BYPASS GRAFTING x 5 -LIMA to LAD -SVG to CIRCUMFLEX -SEQ SVG to DIAGONAL, RAMUS -SVG to DISTAL RCA  ENDOSCOPIC HARVEST GREATER SAPHENOUS VEIN  -Right Leg  TRANSESOPHAGEAL ECHOCARDIOGRAM (TEE) (N/A)  SURGEON:  Surgeon(s) and Role:    * [name], MD - Primary  PHYSICIAN ASSISTANT: Erin Barrett PA-C  ANESTHESIA:   general  EBL:  Total I/O In: 1000 [I.V.:1000] Out: 200 [Urine:200]  BLOOD ADMINISTERED: CELLSAVER  DRAINS: Left Pleural Chest Tube, Mediastinal Chest Drain   LOCAL MEDICATIONS USED:  NONE  SPECIMEN:  No Specimen  DISPOSITION OF SPECIMEN:  N/A  COUNTS:  YES   DICTATION: .Dragon Dictation  PLAN OF CARE: Admit to inpatient   PATIENT DISPOSITION:  ICU - intubated and hemodynamically stable.   Delay start of Pharmacological VTE agent (>24hrs) due to surgical blood loss or risk of bleeding: yes

## 2015-09-19 NOTE — CV Procedure (Signed)
Intra-operative Transesophageal Echocardiography Report:  Dustin Rodriguez is a 59 year old male with a history of Type 2 diabetes, hyperlipidemia, and smoking who presented with complaints of fatigue. Stress test and echocardiography revealed an ejection fraction of 25% with evidence of ischemia. Cardiac catheterization revealed severe three-vessel coronary artery disease. He is now scheduled to undergo coronary artery bypass grafting by Dr. Tyrone Sage. Intraoperative transesophageal echocardiography was indicated to evaluate the left and right ventricular function, to serve as a monitor for intraoperative volume status, and to assess for any valvular pathology.  The patient was brought to the operating room at [facility] and general anesthesia was induced without difficulty. Following induction of anesthesia and endotracheal intubation, the transesophageal echocardiography probe was inserted into the esophagus without difficulty.   Impression: Pre-bypass findings:  1. Aortic valve: The aortic valve was trileaflet. There is some thickening of the leaflets and mild calcification of the noncoronary cusp. However the leaflets opened without restriction. There was no aortic insufficiency.  2. Mitral valve: There was mild mitral annular calcification. The leaflets opened normally without restriction. There was trace mitral insufficiency. There was normal coaptation of the leaflets without prolapsing or flail segments noted.  3. Left ventricle: There was severe left ventricular dysfunction. There was mild global hypokinesis in the basilar to mid region diffusely. However the distal anterior wall, anterior septum, apex, and distal inferior septum were akinetic and thinned. The ejection fraction was estimated at 20-25%. There was no thrombus noted within the left ventricular apex.  4. Right ventricle: The right ventricular cavity was normal in size. There was normal contractility of the right ventricular  free wall and normal right ventricular systolic function.  5. Interatrial septum: Interatrial septum was intact without evidence of patent foramen ovale or atrial septal defect by color Doppler and bubble study.  6. Tricuspid valve: The tricuspid valve appeared structurally normal. There was trace tricuspid insufficiency.  7. Left atrium: There was no thrombus noted within left atrium or left atrial appendage.  8. Ascending aorta: There was moderate calcification noted of the proximal ascending aorta. However there were no protruding atheromata noted. There was a well-defined aortic root and sinotubular ridge without effacement or dilatation.  9. Descending aorta there was scattered calcification noted in the wall of the descending aorta. The descending aorta was of normal diameter and measured 2.25 cm.  Post-bypass findings:  1. Aortic valve: The aortic valve was unchanged from the pre-bypass study. There was no aortic insufficiency.  2. Mitral valve: There was trace mitral insufficiency which was unchanged from the pre-bypass study.  3. Left ventricle: Upon separation from cardiopulmonary bypass, there was a moderate amount of air noted within the left ventricular apex. Separation from cardiopulmonary bypass was not initiated until a substantial amount of air had been ejected from the heart. Approximately 15-30 minutes after separation from cardiopulmonary bypass, no further air could be seen within the heart. The basilar and mid left ventricular segments  showed improved contractility in the post-bypass period. However there was persistent akinesis of the distal anterior wall, apex, and distal interventricular septum and inferior wall. There appeared to be adequate contractility of the lateral wall. The ejection fraction was estimated at 25%.  4. Right ventricle: There was normal appearing right ventricular systolic function and normal right ventricular size.  5. Tricuspid valve: The  tricuspid valve was unchanged from the pre-bypass study. There was trace tricuspid insufficiency.  Kipp Brood, M.D.

## 2015-09-19 NOTE — Addendum Note (Signed)
Addendum  created 09/19/15 1917 by Kipp Brood, MD   Modules edited: Notes Section   Notes Section:  File: [id number]; Pend: [id number]; Pend: [id number]; Beverely Low: [id number]; Pend: [id number]

## 2015-09-19 NOTE — Progress Notes (Deleted)
Began rapid wean protocol of decreasing rate to 4 and FiO2 to 40%, patient is tolerating it well and RT will continue to monitor at bedside during process.

## 2015-09-19 NOTE — Anesthesia Procedure Notes (Signed)
Procedure Name: Intubation Date/Time: 09/19/2015 7:53 AM Performed by: Marena Chancy Pre-anesthesia Checklist: Patient identified, Timeout performed, Emergency Drugs available, Suction available and Patient being monitored Patient Re-evaluated:Patient Re-evaluated prior to inductionOxygen Delivery Method: Circle system utilized Preoxygenation: Pre-oxygenation with 100% oxygen Intubation Type: IV induction Ventilation: Mask ventilation without difficulty and Oral airway inserted - appropriate to patient size Laryngoscope Size: Hyacinth Meeker and 2 Grade View: Grade II Tube type: Oral Tube size: 8.0 mm Number of attempts: 1 Placement Confirmation: ETT inserted through vocal cords under direct vision,  breath sounds checked- equal and bilateral and positive ETCO2 Tube secured with: Tape Dental Injury: Teeth and Oropharynx as per pre-operative assessment

## 2015-09-19 NOTE — Progress Notes (Signed)
      [address]       [name] [address]             [phone]      S/p CABG x 5  Intubated, sedated  Minimal CT output, good UO  BP 105/58 mmHg  Pulse 83  Temp(Src) 98.6 F (37 C) (Oral)  Resp 16  Ht  (1.575 m)  Wt 152 lb 11.2 oz (69.264 kg)  BMI 27.92 kg/m2  SpO2 100%   Intake/Output Summary (Last 24 hours) at 09/19/15 1753 Last data filed at 09/19/15 1700  Gross per 24 hour  Intake 5955.09 ml  Output   8226 ml  Net -2270.91 ml    Doing well early postop  Viviann Spare C. Dorris Fetch, MD [facility] [phone]

## 2015-09-19 NOTE — Procedures (Signed)
Extubation Procedure Note  Patient Details:   Name: Dustin Rodriguez DOB: Jun 01, 1956 MRN: [id number]   Airway Documentation:  Airway 8 mm (Active)  Secured at (cm) 21 cm 09/19/2015  8:09 PM  Measured From Lips 09/19/2015  8:09 PM  Secured Location Right 09/19/2015  8:09 PM  Secured By Pink Tape 09/19/2015  8:09 PM  Site Condition Dry 09/19/2015  8:09 PM    Evaluation  O2 sats: stable throughout Complications: No apparent complications Patient did tolerate procedure well. Bilateral Breath Sounds: Clear   Yes   Patient was extubated to 4L nasal cannula following a successful rapid wean protocol. Patient is tolerating extubation well and RT will continue to monitor.   Azucena Freed 09/19/2015, 8:52 PM

## 2015-09-20 ENCOUNTER — Inpatient Hospital Stay (HOSPITAL_COMMUNITY): Payer: Medicaid Other

## 2015-09-20 ENCOUNTER — Encounter (HOSPITAL_COMMUNITY): Payer: Self-pay | Admitting: Cardiothoracic Surgery

## 2015-09-20 LAB — MAGNESIUM
MAGNESIUM: 2.2 mg/dL (ref 1.7–2.4)
MAGNESIUM: 2.3 mg/dL (ref 1.7–2.4)
Magnesium: 2.1 mg/dL (ref 1.7–2.4)

## 2015-09-20 LAB — GLUCOSE, CAPILLARY
GLUCOSE-CAPILLARY: 100 mg/dL — AB (ref 65–99)
Glucose-Capillary: 104 mg/dL — ABNORMAL HIGH (ref 65–99)
Glucose-Capillary: 105 mg/dL — ABNORMAL HIGH (ref 65–99)
Glucose-Capillary: 106 mg/dL — ABNORMAL HIGH (ref 65–99)
Glucose-Capillary: 106 mg/dL — ABNORMAL HIGH (ref 65–99)
Glucose-Capillary: 112 mg/dL — ABNORMAL HIGH (ref 65–99)
Glucose-Capillary: 119 mg/dL — ABNORMAL HIGH (ref 65–99)
Glucose-Capillary: 126 mg/dL — ABNORMAL HIGH (ref 65–99)
Glucose-Capillary: 159 mg/dL — ABNORMAL HIGH (ref 65–99)
Glucose-Capillary: 215 mg/dL — ABNORMAL HIGH (ref 65–99)
Glucose-Capillary: 96 mg/dL (ref 65–99)

## 2015-09-20 LAB — POCT I-STAT, CHEM 8
BUN: 16 mg/dL (ref 6–20)
CALCIUM ION: 1.2 mmol/L (ref 1.12–1.23)
CREATININE: 0.6 mg/dL — AB (ref 0.61–1.24)
Chloride: 101 mmol/L (ref 101–111)
Glucose, Bld: 184 mg/dL — ABNORMAL HIGH (ref 65–99)
HEMATOCRIT: 37 % — AB (ref 39.0–52.0)
HEMOGLOBIN: 12.6 g/dL — AB (ref 13.0–17.0)
Potassium: 4.5 mmol/L (ref 3.5–5.1)
SODIUM: 138 mmol/L (ref 135–145)
TCO2: 23 mmol/L (ref 0–100)

## 2015-09-20 LAB — BASIC METABOLIC PANEL
Anion gap: 7 (ref 5–15)
BUN: 9 mg/dL (ref 6–20)
CHLORIDE: 104 mmol/L (ref 101–111)
CO2: 25 mmol/L (ref 22–32)
Calcium: 7.9 mg/dL — ABNORMAL LOW (ref 8.9–10.3)
Creatinine, Ser: 0.53 mg/dL — ABNORMAL LOW (ref 0.61–1.24)
GFR calc non Af Amer: >60 mL/min (ref >60)
Glucose, Bld: 106 mg/dL — ABNORMAL HIGH (ref 65–99)
POTASSIUM: 3.6 mmol/L (ref 3.5–5.1)
SODIUM: 136 mmol/L (ref 135–145)

## 2015-09-20 LAB — CBC
HCT: 34.4 % — ABNORMAL LOW (ref 39.0–52.0)
HCT: 35.2 % — ABNORMAL LOW (ref 39.0–52.0)
HEMOGLOBIN: 11.8 g/dL — AB (ref 13.0–17.0)
HEMOGLOBIN: 11.9 g/dL — AB (ref 13.0–17.0)
MCH: 28.5 pg (ref 26.0–34.0)
MCH: 28.5 pg (ref 26.0–34.0)
MCHC: 34.3 g/dL (ref 30.0–36.0)
MCHC: 33.8 g/dL (ref 30.0–36.0)
MCV: 83.1 fL (ref 78.0–100.0)
MCV: 84.2 fL (ref 78.0–100.0)
PLATELETS: 160 10*3/uL (ref 150–400)
Platelets: 142 10*3/uL — ABNORMAL LOW (ref 150–400)
RBC: 4.14 MIL/uL — AB (ref 4.22–5.81)
RBC: 4.18 MIL/uL — AB (ref 4.22–5.81)
RDW: 13.4 % (ref 11.5–15.5)
RDW: 13.7 % (ref 11.5–15.5)
WBC: 16.3 10*3/uL — AB (ref 4.0–10.5)
WBC: 17.5 10*3/uL — ABNORMAL HIGH (ref 4.0–10.5)

## 2015-09-20 LAB — CREATININE, SERUM
Creatinine, Ser: 0.7 mg/dL (ref 0.61–1.24)
Creatinine, Ser: 0.81 mg/dL (ref 0.61–1.24)
GFR calc Af Amer: >60 mL/min (ref >60)
GFR calc non Af Amer: >60 mL/min (ref >60)
GFR calc non Af Amer: >60 mL/min (ref >60)

## 2015-09-20 LAB — HEMOGLOBIN A1C
Hgb A1c MFr Bld: 9.8 % — ABNORMAL HIGH (ref 4.8–5.6)
MEAN PLASMA GLUCOSE: 235 mg/dL

## 2015-09-20 LAB — TROPONIN I
Troponin I: 1.65 ng/mL (ref <0.031)
Troponin I: 1.48 ng/mL (ref <0.031)
Troponin I: 1.08 ng/mL (ref <0.031)

## 2015-09-20 MED ORDER — MILRINONE IN DEXTROSE 20 MG/100ML IV SOLN: 0.1250 ug/kg/min | INTRAVENOUS | Status: DC

## 2015-09-20 MED ORDER — INSULIN DETEMIR 100 UNIT/ML ~~LOC~~ SOLN
15.0000 [IU] | Freq: Once | SUBCUTANEOUS | Status: AC
  Administered 2015-09-20: 15 [IU] via SUBCUTANEOUS
  Filled 2015-09-20: qty 0.15

## 2015-09-20 MED ORDER — INSULIN ASPART 100 UNIT/ML ~~LOC~~ SOLN
0.0000 [IU] | SUBCUTANEOUS | Status: DC
  Administered 2015-09-20: 4 [IU] via SUBCUTANEOUS
  Administered 2015-09-20: 2 [IU] via SUBCUTANEOUS
  Administered 2015-09-20 – 2015-09-21 (×2): 8 [IU] via SUBCUTANEOUS
  Administered 2015-09-21 (×3): 2 [IU] via SUBCUTANEOUS

## 2015-09-20 MED ORDER — MILRINONE IN DEXTROSE 20 MG/100ML IV SOLN
0.1250 ug/kg/min | INTRAVENOUS | Status: DC
  Administered 2015-09-20: 0.125 ug/kg/min via INTRAVENOUS
  Filled 2015-09-20: qty 100

## 2015-09-20 MED ORDER — ENOXAPARIN SODIUM 30 MG/0.3ML ~~LOC~~ SOLN
30.0000 mg | SUBCUTANEOUS | Status: DC
  Filled 2015-09-20 (×2): qty 0.3

## 2015-09-20 MED ORDER — NITROGLYCERIN IN D5W 200-5 MCG/ML-% IV SOLN: 0.0000 ug/min | INTRAVENOUS | Status: DC

## 2015-09-20 MED ORDER — FUROSEMIDE 10 MG/ML IJ SOLN
40.0000 mg | Freq: Once | INTRAMUSCULAR | Status: AC
  Administered 2015-09-20: 40 mg via INTRAVENOUS
  Filled 2015-09-20: qty 4

## 2015-09-20 MED ORDER — ENOXAPARIN SODIUM 30 MG/0.3ML ~~LOC~~ SOLN
30.0000 mg | SUBCUTANEOUS | Status: DC
  Administered 2015-09-20 – 2015-09-25 (×6): 30 mg via SUBCUTANEOUS
  Filled 2015-09-20 (×7): qty 0.3

## 2015-09-20 MED ORDER — POTASSIUM CHLORIDE 10 MEQ/50ML IV SOLN
10.0000 meq | INTRAVENOUS | Status: AC | PRN
  Administered 2015-09-20 (×3): 10 meq via INTRAVENOUS

## 2015-09-20 MED ORDER — INSULIN DETEMIR 100 UNIT/ML ~~LOC~~ SOLN
15.0000 [IU] | Freq: Every day | SUBCUTANEOUS | Status: DC
  Administered 2015-09-21: 15 [IU] via SUBCUTANEOUS
  Filled 2015-09-20 (×2): qty 0.15

## 2015-09-20 MED ORDER — ISOSORBIDE MONONITRATE ER 30 MG PO TB24
30.0000 mg | ORAL_TABLET | Freq: Every day | ORAL | Status: DC
  Administered 2015-09-20 – 2015-09-26 (×7): 30 mg via ORAL
  Filled 2015-09-20 (×7): qty 1

## 2015-09-20 MED FILL — Electrolyte-R (PH 7.4) Solution: INTRAVENOUS | Qty: 3000 | Status: AC

## 2015-09-20 MED FILL — Magnesium Sulfate Inj 50%: INTRAMUSCULAR | Qty: 10 | Status: AC

## 2015-09-20 MED FILL — Lidocaine HCl IV Inj 20 MG/ML: INTRAVENOUS | Qty: 5 | Status: AC

## 2015-09-20 MED FILL — Heparin Sodium (Porcine) Inj 1000 Unit/ML: INTRAMUSCULAR | Qty: 30 | Status: AC

## 2015-09-20 MED FILL — Potassium Chloride Inj 2 mEq/ML: INTRAVENOUS | Qty: 40 | Status: AC

## 2015-09-20 MED FILL — Sodium Chloride IV Soln 0.9%: INTRAVENOUS | Qty: 2000 | Status: AC

## 2015-09-20 MED FILL — Mannitol IV Soln 20%: INTRAVENOUS | Qty: 500 | Status: AC

## 2015-09-20 MED FILL — Sodium Bicarbonate IV Soln 8.4%: INTRAVENOUS | Qty: 50 | Status: AC

## 2015-09-20 NOTE — Progress Notes (Signed)
Patient ID: Dustin Rodriguez, male   DOB: 06/18/1956, 59 y.o.   MRN: [id number] TCTS DAILY ICU PROGRESS NOTE                   [address]            [place] [address]          [phone]   1 Day Post-Op Procedure(s) (LRB): CORONARY ARTERY BYPASS GRAFTING (CABG) (N/A) TRANSESOPHAGEAL ECHOCARDIOGRAM (TEE) (N/A)  Total Length of Stay:  LOS: 4 days   Subjective: awake and neuro intact, incisional pain  Objective: Vital signs in last 24 hours: Temp:  [97.3 F (36.3 C)-99.9 F (37.7 C)] 99.1 F (37.3 C) (10/04 0700) Pulse Rate:  [79-98] 86 (10/04 0700) Cardiac Rhythm:  [-] Normal sinus rhythm (10/04 0700) Resp:  [12-42] 27 (10/04 0700) BP: (95-126)/(46-82) 108/53 mmHg (10/04 0700) SpO2:  [92 %-100 %] 92 % (10/04 0700) Arterial Line BP: (89-180)/(50-80) 107/58 mmHg (10/04 0700) FiO2 (%):  [40 %-60 %] 40 % (10/03 2009) Weight:  [161 lb 6 oz (73.2 kg)] 161 lb 6 oz (73.2 kg) (10/04 0500)  Filed Weights   09/18/15 0424 09/19/15 0501 09/20/15 0500  Weight: 153 lb 14.1 oz (69.8 kg) 152 lb 11.2 oz (69.264 kg) 161 lb 6 oz (73.2 kg)    Weight change: 8 lb 10.8 oz (3.936 kg)   Hemodynamic parameters for last 24 hours: PAP: (29-71)/(13-34) 32/17 mmHg CO:  [3.4 L/min-5.2 L/min] 5.2 L/min CI:  [2 L/min/m2-3 L/min/m2] 3 L/min/m2  Intake/Output from previous day: 10/03 0701 - 10/04 0700 In: 7763.4 [I.V.:5434.4; Blood:599; NG/GT:30; IV Piggyback:1700] Out: 7657 [Urine:4710; Blood:1250; Chest Tube:376]  Intake/Output this shift:    Current Meds: Scheduled Meds: . acetaminophen  1,000 mg Oral 4 times per day   Or  . acetaminophen (TYLENOL) oral liquid 160 mg/5 mL  1,000 mg Per Tube 4 times per day  . antiseptic oral rinse  7 mL Mouth Rinse QID  . aspirin EC  325 mg Oral Daily   Or  . aspirin  324 mg Per Tube Daily  . atorvastatin  80 mg Oral q1800  . bisacodyl  10 mg Oral Daily   Or  . bisacodyl  10 mg Rectal Daily  . cefUROXime (ZINACEF)  IV  1.5 g Intravenous  Q12H  . chlorhexidine gluconate  15 mL Mouth Rinse BID  . docusate sodium  200 mg Oral Daily  . enoxaparin (LOVENOX) injection  30 mg Subcutaneous Q24H  . famotidine (PEPCID) IV  20 mg Intravenous Q12H  . insulin regular  0-10 Units Intravenous TID WC  . metoCLOPramide (REGLAN) injection  10 mg Intravenous 4 times per day  . metoprolol tartrate  12.5 mg Oral BID   Or  . metoprolol tartrate  12.5 mg Per Tube BID  . [START ON 09/21/2015] pantoprazole  40 mg Oral Daily  . sodium chloride  3 mL Intravenous Q12H   Continuous Infusions: . sodium chloride 20 mL/hr at 09/20/15 0700  . sodium chloride    . sodium chloride 20 mL/hr at 09/19/15 2000  . dexmedetomidine Stopped (09/19/15 1940)  . DOPamine 3 mcg/kg/min (09/20/15 0700)  . insulin (NOVOLIN-R) infusion 2.6 Units/hr (09/20/15 0700)  . lactated ringers 20 mL/hr at 09/20/15 0700  . lactated ringers 20 mL/hr at 09/19/15 1900  . milrinone 0.3 mcg/kg/min (09/20/15 0700)  . nitroGLYCERIN 70 mcg/min (09/20/15 0700)  . phenylephrine (NEO-SYNEPHRINE) Adult infusion Stopped (09/20/15 0200)   PRN Meds:.sodium chloride, albumin  human, lactated ringers, metoprolol, midazolam, morphine injection, ondansetron (ZOFRAN) IV, oxyCODONE, potassium chloride, sodium chloride, traMADol  General appearance: alert and cooperative Neurologic: intact Heart: regular rate and rhythm, S1, S2 normal, no murmur, click, rub or gallop Lungs: diminished breath sounds bibasilar Abdomen: soft, non-tender; bowel sounds normal; no masses,  no organomegaly Extremities: extremities normal, atraumatic, no cyanosis or edema and Homans sign is negative, no sign of DVT Wound: intact dressing  Lab Results: CBC: Recent Labs  09/19/15 2145 09/19/15 2203 09/20/15 0409  WBC 15.5*  --  16.3*  HGB 11.5* 11.6* 11.8*  HCT 34.1* 34.0* 34.4*  PLT 152  --  142*   BMET:  Recent Labs  09/18/15 0306  09/19/15 2203 09/20/15 0409  NA 137  < > 137 136  K 3.8  < > 3.9 3.6  CL  102  < > 102 104  CO2 27  --   --  25  GLUCOSE 203*  < > 163* 106*  BUN 16  < > 9 9  CREATININE 0.54*  < > 0.50* 0.53*  CALCIUM 9.2  --   --  7.9*  < > = values in this interval not displayed.  PT/INR:  Recent Labs  09/19/15 1515  LABPROT 17.1*  INR 1.38   Radiology: Dg Chest Port 1 View  09/19/2015   CLINICAL DATA:  Status post CABG  EXAM: PORTABLE CHEST 1 VIEW  COMPARISON:  PA and lateral chest x-ray of September 17, 2015  FINDINGS: The lungs are borderline hypoinflated there is mild left lower lobe subsegmental atelectasis. There is a left-sided chest tube as well as a left-sided mediastinal drain. There is no pneumothorax or significant pleural effusion. The cardiac silhouette is top-normal in size. The central pulmonary vascularity is mildly prominent. The Swan-Ganz catheter tip projects in the region of the proximal right main pulmonary artery. The endotracheal tube tip lies 3 cm above the carina. The esophagogastric tube tip and proximal port lie below the GE junction. There are 8 intact sternal wires and 3 coronary artery graft markers.  IMPRESSION: Postsurgical changes from today's CABG. There is mild interstitial edema and left lower lobe subsegmental atelectasis. There is no significant pleural effusion or pneumothorax. The support tubes are in reasonable position.   Electronically Signed   By: David  [place] M.D.   On: 09/19/2015 16:07     Assessment/Plan: S/P Procedure(s) (LRB): CORONARY ARTERY BYPASS GRAFTING (CABG) (N/A) TRANSESOPHAGEAL ECHOCARDIOGRAM (TEE) (N/A) Mobilize Diuresis Diabetes control d/c tubes/lines Continue foley due to strict I&O See progression orders Follow up ekg and tronponin, question mild st changes /pericardidits but no rub Expected Acute  Blood - loss Anemia    [name] 09/20/2015 7:24 AM

## 2015-09-20 NOTE — Progress Notes (Signed)
Inpatient Diabetes Program Recommendations  AACE/ADA: New Consensus Statement on Inpatient Glycemic Control (2015)  Target Ranges:  Prepandial:   less than 140 mg/dL      Peak postprandial:   less than 180 mg/dL (1-2 hours)      Critically ill patients:  140 - 180 mg/dL   Review of Glycemic Control  Diabetes history: DM 2 Outpatient Diabetes medications: Amaryl 4 mg , Metformin 100 bid,  Current orders for Inpatient glycemic control: IV insulin drip, transition to levemir 15 units and CTCS correction scale  Inpatient Diabetes Program Recommendations: Consult received regarding poor diabetes control prior to surgery. HgbA1C at 9.8%.  Will talk with patient this afternoon if appropriate and assess needs for better controll  Thank you Lenor Coffin, RN, MSN, CDE  Diabetes Inpatient Program Office: [phone] Pager: [phone] 8:00 am to 5:00 pm

## 2015-09-20 NOTE — Progress Notes (Signed)
Inpatient Diabetes Program Recommendations  AACE/ADA: New Consensus Statement on Inpatient Glycemic Control (2015)  Target Ranges:  Prepandial:   less than 140 mg/dL      Peak postprandial:   less than 180 mg/dL (1-2 hours)      Critically ill patients:  140 - 180 mg/dL   Review of Glycemic Control  Attempted to speak with patient regarding home glucose control.  He states that he lost his primary MD due to insurance issues. He also states that the price of insulin is several hundred dollars and he cannot afford.  He states that he use to be followed by Dr Shana Chute, but again lost insurance. Pt is noted to be 'medicaid potential'.Possibly this patient can be see and followed by either our community sickle cell clinic (who also is managing the patients with dm without insurance), and/or go to the clinic in [facility] (but I am not sure of what they can offer patient.)  Pt then got short of breath. I told him I would come back tomorrow/when he is better able to talk. Spoke briefly with son of patient as well.  Will request care management to address issues regarding care of his dm and potential to get insulin(s)./ meds.  Thank you Lenor Coffin, RN, MSN, CDE  Diabetes Inpatient Program Office: [phone] Pager: [phone] 8:00 am to 5:00 pm

## 2015-09-20 NOTE — Op Note (Signed)
Dustin Rodriguez, Dustin Rodriguez.:  [id number]  MEDICAL RECORD NO.:  [id number]  LOCATION:  2S10C                        FACILITY:  [facility]  PHYSICIAN:  Sheliah Plane, MD    DATE OF BIRTH:  [birth year]  DATE OF PROCEDURE:  09/19/2015 DATE OF DISCHARGE:                              OPERATIVE REPORT   PREOPERATIVE DIAGNOSIS:  Three-vessel coronary artery disease with severe LV dysfunction, and poorly controlled diabetes.  POSTOPERATIVE DIAGNOSIS:  Three-vessel coronary artery disease with severe LV dysfunction, and poorly controlled diabetes.  SURGICAL PROCEDURE:  Coronary artery bypass grafting x5, with the left internal mammary to the left anterior descending coronary artery, sequential reverse saphenous vein graft to the diagonal coronary artery and intermediate coronary artery, reverse saphenous vein graft to the circumflex coronary artery, reverse saphenous vein graft to the distal right coronary artery with right leg greater saphenous thigh and calf endoscopic harvesting.Placement of Right femoral A line.  SURGEON:  Sheliah Plane, MD  FIRST ASSISTANT:  Jennye Moccasin, PA.  BRIEF HISTORY:  The patient is a 59 year old, diabetic male, with a long history of smoking, who presents with severe LV dysfunction, 20%-25% ejection fraction by echocardiogram and cardiac catheterization with vague symptoms of fatigue and shortness of breath.  The patient had an echocardiogram done as an outpatient, demonstrating severe LV dysfunction.  Because of this, he was admitted by Dr. Algie Coffer and underwent cardiac catheterization, which revealed total occlusion of the right coronary artery.  High-grade stenosis of the LAD, diagonal, intermediate and total occlusion of the circumflex with faint collateral filling.  Because of the patient's LV function and 3-vessel coronary disease, coronary artery bypass grafting was recommended to him.  The risks and options were discussed in  detail.  He was aware of the increased risk of the procedure, because of poorly controlled diabetes. Hemoglobin A1c of greater than 9 and long-term smoking history.  The patient agreed and signed informed consent.  DESCRIPTION OF PROCEDURE:  With Swan-Ganz and arterial line monitors in place, the patient underwent general endotracheal anesthesia without incident.  Skin of the chest and legs was prepped with Betadine and draped in usual sterile manner. Right femoral a line placed without difficulty over guide wire for possible placement of IAB if needed. Using the [employer] vein harvesting system, vein was harvested from the right thigh and calf, almost to the ankle.  The vein was of adequate quality and caliber.  Median sternotomy was performed.  The left internal mammary artery was dissected down as a pedicle graft.  The distal artery was divided, hydrostatically dilated with heparinized saline.  The mammary artery was slightly small, but appeared to have adequate flow.  Pericardium was opened.  Overall, ventricular function is as described preoperatively on echo.  TEE confirmed this.  He had very trace mitral regurgitation.  He had evidence of global hypokinesis and evidence of anterior apical akinesis but without significant thinning of the myocardium.  The patient was systemically heparinized.  Ascending aorta was cannulated.  The right atrium was cannulated.  An aortic root vent cardioplegia needle was introduced into the ascending aorta.  The patient was placed on cardiopulmonary bypass 2.4 L/min/m2.  Sites of anastomosis were selected and dissected out of the epicardium.  It should be noted that the all the patient's vessels were severely diseased and of poor quality with significant thickening, consistent with diabetes.  Body temperature was cooled to 32 degrees.  Aortic crossclamp was applied 500 mL cold blood potassium cardioplegia was administered with diastolic arrest of  the heart.  We turned our attention first to the distal right coronary artery.  The posterior descending coronary artery was extremely small. With the vessel was opened just at the bifurcation and then had a lumen 1 mm probe passed distally into the PDA and posterolateral branches. Using a running 7-0 Prolene, distal anastomosis was performed. Additional cold blood cardioplegia was administered down the vein graft. Attention was then turned to the circumflex coronary artery, which was totally occluded on the preoperative catheterization.  The vessel was opened, was thickened, but did admit a 1.5 mm probe.  Using a running 7- 0 Prolene, distal anastomosis was performed with a segment of reverse saphenous vein graft.  Attention was then turned to the diagonal and intermediate coronary arteries.  The diagonal vessel was opened, was 1.2 mm in size.  Using a diamond-type side-to-side anastomosis with a running 8-0 Prolene, anastomosis was performed.  Distal extent of the same vein was then carried short distance to the intermediate coronary artery, which was slightly smaller, but did admit a 1 mm probe.  Using a running 8-0 Prolene, distal anastomosis was performed.  Attention was then turned to the left anterior descending coronary artery.  In the mid portion of the vessel, LAD was opened and admitted a 1.5 mm probe proximally and distally.  Using a running 8-0 Prolene, left internal mammary artery was anastomosed to the left anterior descending coronary artery.  With initial removal of the bulldog on the mammary artery, there was a slight rise in myocardial septal temperature, but not as expected.  For this reason, the bulldog was placed back on the mammary artery.  The anastomosis was taken down, although no definite technical abnormalities were noted with the anastomosis.  We trimmed the mammary artery back, slightly shorter, where it was a little bit larger and we re-did the anastomosis  with improved flow.  We then turned our attention to the proximal anastomosis, where 3 proximal punch aortotomies were performed and each of the 3 vein grafts were anastomosed to the ascending aorta.  Bulldog was removed from the mammary artery with rise in myocardial septal temperature.  The heart was allowed to passively fill and de-air.  An aortic cross-clamp was removed with a total crossclamp time of 138 minutes.  The patient spontaneously converted to a sinus rhythm.  He had been started on dopamine and milrinone infusion because of his poor LV function.  Sites of anastomosis were inspected and several additional sutures were placed in the circumflex graft. Anastomosis for hemostasis and with the patient's body temperature rewarmed to 37 degrees, we began weaning the patient from bypass.  He did develop an episode of SVT and required sync cardioversion and returned to a sinus rhythm.  Atrial and ventricular pacing wires were applied.  He was then ventilated and weaned from cardiopulmonary bypass and remained hemodynamically stable.  Echo confirmed reasonable contractility and cardiac index of over 2.  He was decannulated in usual fashion.  Protamine sulfate was administered.  Graft markers were applied.  The left pleural tube and a Blake mediastinal drain were left in place.  Pericardium was loosely reapproximated.  The  sternum was closed with #6 stainless steel wire.  Fascia was closed with interrupted 0 Vicryl and 3-0 Vicryl subcutaneous tissue, and a 3-0 subcuticular stitch in the skin edges.  Dry dressings were applied.  Sponge and needle count was reported as correct at the completion of procedure. The patient tolerated procedure without obvious complication and was transferred to the Surgical Intensive Care Unit for further postoperative care.  Because of the patient's severe diabetes and poor distal vessels, re-do coronary artery bypass grafting would be a difficult  undertaking.  Total pump time was 202 minutes.     Sheliah Plane, MD     EG/MEDQ  D:  09/20/2015  T:  09/20/2015  Job:  [id number]

## 2015-09-20 NOTE — Progress Notes (Signed)
EKG CRITICAL VALUE     12 lead EKG performed.  Critical value noted. Vashti Hey, RN notified.   Wandalee Ferdinand, CCT 09/20/2015 7:40 AM

## 2015-09-20 NOTE — Progress Notes (Signed)
Utilization Review Completed.Korine Winton T10/03/2015  

## 2015-09-20 NOTE — Progress Notes (Signed)
TCTS BRIEF SICU PROGRESS NOTE  1 Day Post-Op  S/P Procedure(s) (LRB): CORONARY ARTERY BYPASS GRAFTING (CABG) (N/A) TRANSESOPHAGEAL ECHOCARDIOGRAM (TEE) (N/A)   Stable day NSR w/ stable BP on dopamine @ 3 and milrinone @ 0.125 UOP adequate Labs okay  Plan: Continue current plan  Purcell Nails, MD 09/20/2015 7:13 PM

## 2015-09-20 NOTE — Clinical Documentation Improvement (Signed)
Cardiothoracic Surgery  Possible Clinical Conditions:  - Acute on chronic systolic heart failure  - Other condition  - Unable to clinically determine  Clinical Information: Severe LV dysfunction, EF 20-25% by Echo EF 20-25% by LV gram Lasix 40 mg IV x 1 on 09/20/15  (please document your response in the progress notes and not on the query form itself.)   Please exercise your independent, professional judgment when responding. A specific answer is not anticipated or expected.   Thank You, Jerral Ralph  RN BSN CCDS [phone] Health Information Management [place]

## 2015-09-21 ENCOUNTER — Inpatient Hospital Stay (HOSPITAL_COMMUNITY): Payer: Medicaid Other

## 2015-09-21 LAB — BASIC METABOLIC PANEL
Anion gap: 8 (ref 5–15)
BUN: 13 mg/dL (ref 6–20)
CO2: 28 mmol/L (ref 22–32)
Calcium: 8.5 mg/dL — ABNORMAL LOW (ref 8.9–10.3)
Chloride: 99 mmol/L — ABNORMAL LOW (ref 101–111)
Creatinine, Ser: 0.69 mg/dL (ref 0.61–1.24)
GFR calc Af Amer: >60 mL/min (ref >60)
GFR calc non Af Amer: >60 mL/min (ref >60)
Glucose, Bld: 126 mg/dL — ABNORMAL HIGH (ref 65–99)
Potassium: 4.2 mmol/L (ref 3.5–5.1)
Sodium: 135 mmol/L (ref 135–145)

## 2015-09-21 LAB — GLUCOSE, CAPILLARY
GLUCOSE-CAPILLARY: 108 mg/dL — AB (ref 65–99)
GLUCOSE-CAPILLARY: 127 mg/dL — AB (ref 65–99)
GLUCOSE-CAPILLARY: 154 mg/dL — AB (ref 65–99)
GLUCOSE-CAPILLARY: 157 mg/dL — AB (ref 65–99)
GLUCOSE-CAPILLARY: 145 mg/dL — AB (ref 65–99)
Glucose-Capillary: 125 mg/dL — ABNORMAL HIGH (ref 65–99)
Glucose-Capillary: 119 mg/dL — ABNORMAL HIGH (ref 65–99)
Glucose-Capillary: 124 mg/dL — ABNORMAL HIGH (ref 65–99)
Glucose-Capillary: 111 mg/dL — ABNORMAL HIGH (ref 65–99)
Glucose-Capillary: 227 mg/dL — ABNORMAL HIGH (ref 65–99)

## 2015-09-21 LAB — CBC
HCT: 32.9 % — ABNORMAL LOW (ref 39.0–52.0)
Hemoglobin: 11.2 g/dL — ABNORMAL LOW (ref 13.0–17.0)
MCH: 28.6 pg (ref 26.0–34.0)
MCHC: 34 g/dL (ref 30.0–36.0)
MCV: 84.1 fL (ref 78.0–100.0)
Platelets: 145 10*3/uL — ABNORMAL LOW (ref 150–400)
RBC: 3.91 MIL/uL — ABNORMAL LOW (ref 4.22–5.81)
RDW: 13.8 % (ref 11.5–15.5)
WBC: 15.1 10*3/uL — ABNORMAL HIGH (ref 4.0–10.5)

## 2015-09-21 MED ORDER — METOCLOPRAMIDE HCL 10 MG PO TABS
10.0000 mg | ORAL_TABLET | Freq: Three times a day (TID) | ORAL | Status: DC
  Administered 2015-09-21 – 2015-09-22 (×2): 10 mg via ORAL
  Filled 2015-09-21 (×3): qty 1

## 2015-09-21 MED ORDER — FUROSEMIDE 10 MG/ML IJ SOLN
40.0000 mg | Freq: Once | INTRAMUSCULAR | Status: AC
  Administered 2015-09-21: 40 mg via INTRAVENOUS
  Filled 2015-09-21: qty 4

## 2015-09-21 MED ORDER — CETYLPYRIDINIUM CHLORIDE 0.05 % MT LIQD
7.0000 mL | Freq: Two times a day (BID) | OROMUCOSAL | Status: DC
  Administered 2015-09-21 – 2015-09-26 (×9): 7 mL via OROMUCOSAL

## 2015-09-21 MED ORDER — LEVALBUTEROL HCL 0.63 MG/3ML IN NEBU
0.6300 mg | INHALATION_SOLUTION | Freq: Three times a day (TID) | RESPIRATORY_TRACT | Status: DC
  Administered 2015-09-21 – 2015-09-24 (×9): 0.63 mg via RESPIRATORY_TRACT
  Filled 2015-09-21 (×13): qty 3

## 2015-09-21 NOTE — Progress Notes (Signed)
CT surgery p.m. Rounds  Patient up in chair, wishes to ambulate once more this evening No more emesis since this morning Abdomen soft but with diminished bowel sounds Maintaining sinus rhythm

## 2015-09-21 NOTE — Progress Notes (Addendum)
[address]       [name] [address]             [phone]      2 Days Post-Op Procedure(s) (LRB): CORONARY ARTERY BYPASS GRAFTING (CABG) (N/A) TRANSESOPHAGEAL ECHOCARDIOGRAM (TEE) (N/A)   Subjective:  Mr. Kesling complains of pain from foley catheter.  He wants this removed.  He otherwise states pain medication is controlling pain.  Objective: Vital signs in last 24 hours: Temp:  [97.5 F (36.4 C)-99.1 F (37.3 C)] 98.3 F (36.8 C) (10/05 0415) Pulse Rate:  [85-103] 95 (10/05 0700) Cardiac Rhythm:  [-] Normal sinus rhythm (10/05 0700) Resp:  [13-45] 31 (10/05 0700) BP: (101-147)/(48-72) 118/61 mmHg (10/05 0700) SpO2:  [89 %-98 %] 93 % (10/05 0700) Arterial Line BP: (88-207)/(50-157) 140/56 mmHg (10/05 0500) Weight:  [160 lb 4.4 oz (72.7 kg)] 160 lb 4.4 oz (72.7 kg) (10/05 0500)  Hemodynamic parameters for last 24 hours: PAP: (22-62)/(12-31) 22/14 mmHg CO:  [5.2 L/min] 5.2 L/min CI:  [3.1 L/min/m2] 3.1 L/min/m2  Intake/Output from previous day: 10/04 0701 - 10/05 0700 In: 975.4 [I.V.:875.4; IV Piggyback:100] Out: 1985 [Urine:1780; Chest Tube:205]  General appearance: alert, cooperative and no distress Heart: regular rate and rhythm Lungs: diminished breath sounds bibasilar Abdomen: soft, non-tender; bowel sounds normal; no masses,  no organomegaly Extremities: edema 1+ Wound: clean and dry  Lab Results:  Recent Labs  09/20/15 1654 09/21/15 0400  WBC 17.5* 15.1*  HGB 11.9* 11.2*  HCT 35.2* 32.9*  PLT 160 145*   BMET:  Recent Labs  09/20/15 0409  09/20/15 1602 09/20/15 2000 09/21/15 0400  NA 136  --  138  --  135  K 3.6  --  4.5  --  4.2  CL 104  --  101  --  99*  CO2 25  --   --   --  28  GLUCOSE 106*  --  184*  --  126*  BUN 9  --  16  --  13  CREATININE 0.53*  < > 0.60* 0.81 0.69  CALCIUM 7.9*  --   --   --  8.5*  < > = values in this interval not displayed.  PT/INR:  Recent Labs  09/19/15 1515  LABPROT 17.1*  INR  1.38   ABG    Component Value Date/Time   PHART 7.368 09/19/2015 2204   HCO3 25.1* 09/19/2015 2204   TCO2 23 09/20/2015 1602   ACIDBASEDEF 2.0 09/19/2015 1551   O2SAT 93.0 09/19/2015 2204   CBG (last 3)   Recent Labs  09/20/15 1935 09/20/15 2341 09/21/15 0359  GLUCAP 215* 159* 111*   Lab Results  Component Value Date   TROPONINI 1.08* 09/20/2015    Dg Chest Port 1 View  09/21/2015   CLINICAL DATA:  CABG.  EXAM: PORTABLE CHEST 1 VIEW  COMPARISON:  09/20/2015.  FINDINGS: Interim removal Swan-Ganz catheter and mediastinal drainage catheter. Right IJ line and left chest tube in stable position. Prior CABG. Cardiomegaly. Interim improvement of bilateral pulmonary infiltrates consistent with improving pulmonary edema. Low lung volumes. No prominent pleural effusion or pneumothorax.  IMPRESSION: 1. Interim removal Swan-Ganz catheter mediastinal drainage catheter. Remaining lines and tubes in stable position. 2. Prior CABG. Heart size stable. Interim partial clearing of bilateral pulmonary edema. 3. Low lung volumes with basilar atelectasis.   Electronically Signed   By: Maisie Fus  Register   On: 09/21/2015 08:15   Assessment/Plan: S/P Procedure(s) (LRB): CORONARY ARTERY BYPASS  GRAFTING (CABG) (N/A) TRANSESOPHAGEAL ECHOCARDIOGRAM (TEE) (N/A)  1. CV-NSR, hemodynamically stable on Milrinone gtt, continue Lopressor 2. Pulm- wean oxygen as tolerated, CXR with bilateral atelectasis- will order flutter valve, start Xopenex as patient is a 2ppd smoker, CT with 205 cc output yesterday, possibly remove today 3. Renal- creatinine WNL, remains hypervolemic, will repeat dose of IV Lasix today 4. DM- cbgs controlled, continue current regimen 5. Expected Post Operative Blood Loss Anemia- mild Hgb remains stable at 11.2 6. Dispo- patient stable, weaning Milrinone drip as tolerated, add flutter valve, remove foley catheter, diurese, leave in SICU for now   LOS: 5 days    BARRETT,  ERIN 09/21/2015   Weaning off drips D/c chest tube Chest xray improving Dm better controlled, will need d/c plan for dm treatment I have seen and examined Karl Pock and agree with the above assessment  and plan.  [name] Beeper [phone] Office [phone] 09/21/2015 10:26 AM

## 2015-09-21 NOTE — Progress Notes (Signed)
EKG CRITICAL VALUE     12 lead EKG performed.  Critical value noted. Vashti Hey, RN notified.   Wandalee Ferdinand, CCT 09/21/2015 7:22 AM

## 2015-09-21 NOTE — Progress Notes (Signed)
Anesthesiology follow-up:  Awake and alert, neuro intact, sitting in chair. Having mild incisional pain, hemodynamically stable off pressors.  VS: T- 36.8 BP- 118/61 HR- 95 (SR) RR-24 O2 Sat 03% on nasal  Canula  K-4.2 BUN/Cr. 13/0.69 glucose 235 H/H- 11.2/32.9 Platelets- 145,000  Extubated 5 hours post-op.  Doing well overall, should be OK to transfer to 2000 today.  Dustin Rodriguez

## 2015-09-21 NOTE — Addendum Note (Signed)
Addendum  created 09/21/15 1610 by Kipp Brood, MD   Modules edited: Notes Section   Notes Section:  File: [id number]; Pend: [id number]; Pend: [id number]; Pend: [phone]

## 2015-09-22 ENCOUNTER — Inpatient Hospital Stay (HOSPITAL_COMMUNITY): Payer: Medicaid Other

## 2015-09-22 LAB — CBC
HCT: 29 % — ABNORMAL LOW (ref 39.0–52.0)
Hemoglobin: 9.7 g/dL — ABNORMAL LOW (ref 13.0–17.0)
MCH: 28.5 pg (ref 26.0–34.0)
MCHC: 33.4 g/dL (ref 30.0–36.0)
MCV: 85.3 fL (ref 78.0–100.0)
Platelets: 143 10*3/uL — ABNORMAL LOW (ref 150–400)
RBC: 3.4 MIL/uL — ABNORMAL LOW (ref 4.22–5.81)
RDW: 13.9 % (ref 11.5–15.5)
WBC: 8.4 10*3/uL (ref 4.0–10.5)

## 2015-09-22 LAB — BASIC METABOLIC PANEL
ANION GAP: 9 (ref 5–15)
BUN: 19 mg/dL (ref 6–20)
CALCIUM: 8.3 mg/dL — AB (ref 8.9–10.3)
CO2: 30 mmol/L (ref 22–32)
Chloride: 99 mmol/L — ABNORMAL LOW (ref 101–111)
Creatinine, Ser: 0.67 mg/dL (ref 0.61–1.24)
GFR calc Af Amer: >60 mL/min (ref >60)
GLUCOSE: 71 mg/dL (ref 65–99)
POTASSIUM: 3.9 mmol/L (ref 3.5–5.1)
SODIUM: 138 mmol/L (ref 135–145)

## 2015-09-22 LAB — GLUCOSE, CAPILLARY
GLUCOSE-CAPILLARY: 133 mg/dL — AB (ref 65–99)
GLUCOSE-CAPILLARY: 70 mg/dL (ref 65–99)
GLUCOSE-CAPILLARY: 87 mg/dL (ref 65–99)
GLUCOSE-CAPILLARY: 89 mg/dL (ref 65–99)
Glucose-Capillary: 217 mg/dL — ABNORMAL HIGH (ref 65–99)
Glucose-Capillary: 215 mg/dL — ABNORMAL HIGH (ref 65–99)

## 2015-09-22 MED ORDER — CARVEDILOL 6.25 MG PO TABS
6.2500 mg | ORAL_TABLET | Freq: Two times a day (BID) | ORAL | Status: DC
  Administered 2015-09-22 – 2015-09-24 (×5): 6.25 mg via ORAL
  Filled 2015-09-22 (×7): qty 1

## 2015-09-22 MED ORDER — METFORMIN HCL 500 MG PO TABS
1000.0000 mg | ORAL_TABLET | Freq: Two times a day (BID) | ORAL | Status: DC
  Administered 2015-09-22 – 2015-09-26 (×9): 1000 mg via ORAL
  Filled 2015-09-22 (×11): qty 2

## 2015-09-22 MED ORDER — ASPIRIN EC 325 MG PO TBEC
325.0000 mg | DELAYED_RELEASE_TABLET | Freq: Every day | ORAL | Status: DC
  Administered 2015-09-22 – 2015-09-26 (×5): 325 mg via ORAL
  Filled 2015-09-22 (×5): qty 1

## 2015-09-22 MED ORDER — SODIUM CHLORIDE 0.9 % IJ SOLN
3.0000 mL | Freq: Two times a day (BID) | INTRAMUSCULAR | Status: DC
  Administered 2015-09-22 – 2015-09-26 (×7): 3 mL via INTRAVENOUS

## 2015-09-22 MED ORDER — INSULIN ASPART 100 UNIT/ML ~~LOC~~ SOLN
0.0000 [IU] | Freq: Three times a day (TID) | SUBCUTANEOUS | Status: DC
  Administered 2015-09-22 – 2015-09-23 (×3): 8 [IU] via SUBCUTANEOUS
  Administered 2015-09-23: 12 [IU] via SUBCUTANEOUS
  Administered 2015-09-23: 8 [IU] via SUBCUTANEOUS
  Administered 2015-09-24: 12 [IU] via SUBCUTANEOUS
  Administered 2015-09-24: 4 [IU] via SUBCUTANEOUS
  Administered 2015-09-24: 8 [IU] via SUBCUTANEOUS
  Administered 2015-09-25: 4 [IU] via SUBCUTANEOUS
  Administered 2015-09-25: 8 [IU] via SUBCUTANEOUS
  Administered 2015-09-25: 4 [IU] via SUBCUTANEOUS
  Administered 2015-09-26: 8 [IU] via SUBCUTANEOUS

## 2015-09-22 MED ORDER — MOVING RIGHT ALONG BOOK
Freq: Once | Status: AC
  Administered 2015-09-22: 1
  Filled 2015-09-22: qty 1

## 2015-09-22 MED ORDER — ONDANSETRON HCL 4 MG/2ML IJ SOLN: 4.0000 mg | Freq: Four times a day (QID) | INTRAMUSCULAR | Status: DC | PRN

## 2015-09-22 MED ORDER — OXYCODONE HCL 5 MG PO TABS
5.0000 mg | ORAL_TABLET | ORAL | Status: DC | PRN
  Administered 2015-09-23 (×2): 10 mg via ORAL
  Administered 2015-09-24 – 2015-09-25 (×8): 5 mg via ORAL
  Administered 2015-09-26 (×2): 10 mg via ORAL
  Filled 2015-09-22: qty 2
  Filled 2015-09-22 (×2): qty 1
  Filled 2015-09-22: qty 2
  Filled 2015-09-22: qty 1
  Filled 2015-09-22: qty 2
  Filled 2015-09-22 (×6): qty 1
  Filled 2015-09-22: qty 2

## 2015-09-22 MED ORDER — BISACODYL 10 MG RE SUPP: 10.0000 mg | Freq: Every day | RECTAL | Status: DC | PRN

## 2015-09-22 MED ORDER — ACETAMINOPHEN 325 MG PO TABS: 650.0000 mg | ORAL_TABLET | Freq: Four times a day (QID) | ORAL | Status: DC | PRN

## 2015-09-22 MED ORDER — BISACODYL 5 MG PO TBEC
10.0000 mg | DELAYED_RELEASE_TABLET | Freq: Every day | ORAL | Status: DC | PRN
  Administered 2015-09-24 – 2015-09-26 (×2): 10 mg via ORAL
  Filled 2015-09-22: qty 2

## 2015-09-22 MED ORDER — ONDANSETRON HCL 4 MG PO TABS: 4.0000 mg | ORAL_TABLET | Freq: Four times a day (QID) | ORAL | Status: DC | PRN

## 2015-09-22 MED ORDER — TRAMADOL HCL 50 MG PO TABS
50.0000 mg | ORAL_TABLET | ORAL | Status: DC | PRN
  Administered 2015-09-22: 100 mg via ORAL
  Administered 2015-09-22 – 2015-09-24 (×4): 50 mg via ORAL
  Administered 2015-09-24: 100 mg via ORAL
  Filled 2015-09-22 (×2): qty 1
  Filled 2015-09-22: qty 2
  Filled 2015-09-22 (×2): qty 1
  Filled 2015-09-22: qty 2

## 2015-09-22 MED ORDER — POTASSIUM CHLORIDE CRYS ER 10 MEQ PO TBCR
10.0000 meq | EXTENDED_RELEASE_TABLET | Freq: Every day | ORAL | Status: DC
  Administered 2015-09-22 – 2015-09-26 (×5): 10 meq via ORAL
  Filled 2015-09-22 (×5): qty 1

## 2015-09-22 MED ORDER — GLIMEPIRIDE 4 MG PO TABS
4.0000 mg | ORAL_TABLET | Freq: Two times a day (BID) | ORAL | Status: DC
  Administered 2015-09-22 – 2015-09-26 (×9): 4 mg via ORAL
  Filled 2015-09-22 (×11): qty 1

## 2015-09-22 MED ORDER — SODIUM CHLORIDE 0.9 % IJ SOLN
3.0000 mL | INTRAMUSCULAR | Status: DC | PRN
  Administered 2015-09-22 – 2015-09-23 (×2): 3 mL via INTRAVENOUS
  Filled 2015-09-22 (×2): qty 3

## 2015-09-22 MED ORDER — FUROSEMIDE 20 MG PO TABS
20.0000 mg | ORAL_TABLET | Freq: Every day | ORAL | Status: DC
  Administered 2015-09-22 – 2015-09-25 (×4): 20 mg via ORAL
  Filled 2015-09-22 (×3): qty 1

## 2015-09-22 MED ORDER — GUAIFENESIN ER 600 MG PO TB12: 600.0000 mg | ORAL_TABLET | Freq: Two times a day (BID) | ORAL | Status: DC | PRN

## 2015-09-22 MED ORDER — PANTOPRAZOLE SODIUM 40 MG PO TBEC
40.0000 mg | DELAYED_RELEASE_TABLET | Freq: Every day | ORAL | Status: DC
  Administered 2015-09-22 – 2015-09-26 (×5): 40 mg via ORAL
  Filled 2015-09-22 (×7): qty 1

## 2015-09-22 MED ORDER — ISOSORBIDE MONONITRATE ER 30 MG PO TB24: 30.0000 mg | ORAL_TABLET | Freq: Every day | ORAL | Status: DC

## 2015-09-22 MED ORDER — SODIUM CHLORIDE 0.9 % IV SOLN: 250.0000 mL | INTRAVENOUS | Status: DC | PRN

## 2015-09-22 MED ORDER — LISINOPRIL 2.5 MG PO TABS
2.5000 mg | ORAL_TABLET | Freq: Every day | ORAL | Status: DC
  Administered 2015-09-22 – 2015-09-26 (×5): 2.5 mg via ORAL
  Filled 2015-09-22 (×5): qty 1

## 2015-09-22 MED ORDER — DOCUSATE SODIUM 100 MG PO CAPS
200.0000 mg | ORAL_CAPSULE | Freq: Every day | ORAL | Status: DC
  Administered 2015-09-22 – 2015-09-24 (×3): 200 mg via ORAL
  Administered 2015-09-25: 100 mg via ORAL
  Administered 2015-09-26: 200 mg via ORAL
  Filled 2015-09-22 (×5): qty 2

## 2015-09-22 NOTE — Progress Notes (Signed)
Ref: Pola Corn, MD   Subjective:  Doing better. Off pressure agents. Ambulating. Needs assistance in reapplying medicaid.  Objective:  Vital Signs in the last 24 hours: Temp:  [97.7 F (36.5 C)-99 F (37.2 C)] 97.7 F (36.5 C) (10/06 1600) Pulse Rate:  [79-98] 96 (10/06 1500) Cardiac Rhythm:  [-] Normal sinus rhythm;Sinus tachycardia (10/06 1600) Resp:  [15-40] 34 (10/06 1600) BP: (92-135)/(47-85) 101/55 mmHg (10/06 1600) SpO2:  [91 %-100 %] 94 % (10/06 1600) Weight:  [72.5 kg (159 lb 13.3 oz)] 72.5 kg (159 lb 13.3 oz) (10/06 0600)  Physical Exam: BP Readings from Last 1 Encounters:  09/22/15 101/55    Wt Readings from Last 1 Encounters:  09/22/15 72.5 kg (159 lb 13.3 oz)    Weight change: -0.2 kg (-7.1 oz)  HEENT: [place]/AT, Eyes-Brown, PERL, EOMI, Conjunctiva-Pink, Sclera-Non-icteric Neck: No JVD, No bruit, Trachea midline. Lungs:  Clear, Bilateral. Midline scar of surgery Cardiac:  Regular rhythm, normal S1 and S2, no S3. II/VI systolic murmur. Abdomen:  Soft, non-tender. Extremities:  Minimal edema + right leg dressing present. No cyanosis. + clubbing. CNS: AxOx3, Cranial nerves grossly intact, moves all 4 extremities. Right handed. Skin: Warm and dry.   Intake/Output from previous day: 10/05 0701 - 10/06 0700 In: 904.7 [P.O.:360; I.V.:544.7] Out: 1535 [Urine:1535]    Lab Results: BMET    Component Value Date/Time   NA 138 09/22/2015 0400   NA 135 09/21/2015 0400   NA 138 09/20/2015 1602   K 3.9 09/22/2015 0400   K 4.2 09/21/2015 0400   K 4.5 09/20/2015 1602   CL 99* 09/22/2015 0400   CL 99* 09/21/2015 0400   CL 101 09/20/2015 1602   CO2 30 09/22/2015 0400   CO2 28 09/21/2015 0400   CO2 25 09/20/2015 0409   GLUCOSE 71 09/22/2015 0400   GLUCOSE 126* 09/21/2015 0400   GLUCOSE 184* 09/20/2015 1602   BUN 19 09/22/2015 0400   BUN 13 09/21/2015 0400   BUN 16 09/20/2015 1602   CREATININE 0.67 09/22/2015 0400   CREATININE 0.69 09/21/2015 0400   CREATININE 0.81 09/20/2015 2000   CALCIUM 8.3* 09/22/2015 0400   CALCIUM 8.5* 09/21/2015 0400   CALCIUM 7.9* 09/20/2015 0409   GFRNONAA >60 09/22/2015 0400   GFRNONAA >60 09/21/2015 0400   GFRNONAA >60 09/20/2015 2000   GFRAA >60 09/22/2015 0400   GFRAA >60 09/21/2015 0400   GFRAA >60 09/20/2015 2000   CBC    Component Value Date/Time   WBC 8.4 09/22/2015 0400   RBC 3.40* 09/22/2015 0400   HGB 9.7* 09/22/2015 0400   HCT 29.0* 09/22/2015 0400   PLT 143* 09/22/2015 0400   MCV 85.3 09/22/2015 0400   MCH 28.5 09/22/2015 0400   MCHC 33.4 09/22/2015 0400   RDW 13.9 09/22/2015 0400   HEPATIC Function Panel  Recent Labs  09/17/15 0151 09/18/15 0306  PROT 6.0* 6.3*   HEMOGLOBIN A1C No components found for: HGA1C,  MPG CARDIAC ENZYMES Lab Results  Component Value Date   TROPONINI 1.08* 09/20/2015   TROPONINI 1.48* 09/20/2015   TROPONINI 1.65* 09/20/2015   BNP No results for input(s): PROBNP in the last 8760 hours. TSH No results for input(s): TSH in the last 8760 hours. CHOLESTEROL No results for input(s): CHOL in the last 8760 hours.  Scheduled Meds: . antiseptic oral rinse  7 mL Mouth Rinse BID  . aspirin EC  325 mg Oral Daily  . atorvastatin  80 mg Oral q1800  . carvedilol  6.25 mg  Oral BID WC  . docusate sodium  200 mg Oral Daily  . enoxaparin (LOVENOX) injection  30 mg Subcutaneous Q24H  . furosemide  20 mg Oral Daily  . glimepiride  4 mg Oral BID WC  . insulin aspart  0-24 Units Subcutaneous TID AC & HS  . isosorbide mononitrate  30 mg Oral Daily  . levalbuterol  0.63 mg Nebulization TID  . lisinopril  2.5 mg Oral Daily  . metFORMIN  1,000 mg Oral BID WC  . pantoprazole  40 mg Oral QAC breakfast  . potassium chloride  10 mEq Oral Daily  . 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  Office f/u in 1 week post discharge.   LOS: 6 days    Orpah Cobb  MD  09/22/2015, 5:36 PM

## 2015-09-22 NOTE — Care Management Note (Signed)
Case Management Note  Patient Details  Name: SARTHAK RUBENSTEIN MRN: [id number] Date of Birth: 1956/07/22  Subjective/Objective:       Talked with wife and patient yesterday.  Patient adamant that he has Medicaid.  Wife in agreement.  Talked with Artist, [name].  Confirmed that he did have Medicaid but it has expired.  She is not sure why.  Talked with wife again today without husband and she states that he has not been able to use Medicaid, states they needed something, but unsure what it was.  Encouraged his SW at [facility] to see what they need to reinstated.  States he has  Been using his orange card to get his medications and has not had a problem getting his meds.              Action/Plan:   Expected Discharge Date:                  Expected Discharge Plan:  Home/Self Care  In-House Referral:     Discharge planning Services     Post Acute Care Choice:    Choice offered to:     DME Arranged:    DME Agency:     HH Arranged:    HH Agency:     Status of Service:  In process, will continue to follow  Medicare Important Message Given:    Date Medicare IM Given:    Medicare IM give by:    Date Additional Medicare IM Given:    Additional Medicare Important Message give by:     If discussed at Long Length of Stay Meetings, dates discussed:    Additional Comments:  Vangie Bicker, RN 09/22/2015, 2:36 PM

## 2015-09-22 NOTE — Progress Notes (Signed)
Patient ID: Dustin Rodriguez, male   DOB: September 02, 1956, 59 y.o.   MRN: [id number] TCTS DAILY ICU PROGRESS NOTE                   [address]            [place] [address]          [phone]   3 Days Post-Op Procedure(s) (LRB): CORONARY ARTERY BYPASS GRAFTING (CABG) (N/A) TRANSESOPHAGEAL ECHOCARDIOGRAM (TEE) (N/A)  Total Length of Stay:  LOS: 6 days   Subjective: Walked to xray this am  Objective: Vital signs in last 24 hours: Temp:  [97.4 F (36.3 C)-99 F (37.2 C)] 97.9 F (36.6 C) (10/06 0400) Pulse Rate:  [79-102] 87 (10/06 0700) Cardiac Rhythm:  [-] Normal sinus rhythm (10/06 0700) Resp:  [14-36] 33 (10/06 0700) BP: (89-135)/(45-85) 123/85 mmHg (10/06 0700) SpO2:  [92 %-100 %] 93 % (10/06 0700) Weight:  [159 lb 13.3 oz (72.5 kg)] 159 lb 13.3 oz (72.5 kg) (10/06 0600)  Filed Weights   09/20/15 0500 09/21/15 0500 09/22/15 0600  Weight: 161 lb 6 oz (73.2 kg) 160 lb 4.4 oz (72.7 kg) 159 lb 13.3 oz (72.5 kg)    Weight change: -7.1 oz (-0.2 kg)   Hemodynamic parameters for last 24 hours:    Intake/Output from previous day: 10/05 0701 - 10/06 0700 In: 904.7 [P.O.:360; I.V.:544.7] Out: 1535 [Urine:1535]  Intake/Output this shift:    Current Meds: Scheduled Meds: . acetaminophen  1,000 mg Oral 4 times per day   Or  . acetaminophen (TYLENOL) oral liquid 160 mg/5 mL  1,000 mg Per Tube 4 times per day  . antiseptic oral rinse  7 mL Mouth Rinse BID  . aspirin EC  325 mg Oral Daily   Or  . aspirin  324 mg Per Tube Daily  . atorvastatin  80 mg Oral q1800  . bisacodyl  10 mg Oral Daily   Or  . bisacodyl  10 mg Rectal Daily  . docusate sodium  200 mg Oral Daily  . enoxaparin (LOVENOX) injection  30 mg Subcutaneous Q24H  . insulin aspart  0-24 Units Subcutaneous 6 times per day  . insulin detemir  15 Units Subcutaneous Daily  . isosorbide mononitrate  30 mg Oral Daily  . levalbuterol  0.63 mg Nebulization TID  . metoCLOPramide  10 mg Oral TID AC  .  metoprolol tartrate  12.5 mg Oral BID   Or  . metoprolol tartrate  12.5 mg Per Tube BID  . pantoprazole  40 mg Oral Daily  . sodium chloride  3 mL Intravenous Q12H   Continuous Infusions: . sodium chloride 20 mL/hr at 09/20/15 1400  . sodium chloride    . sodium chloride 20 mL/hr at 09/19/15 2000  . dexmedetomidine Stopped (09/19/15 1940)  . lactated ringers 20 mL/hr at 09/22/15 0700  . lactated ringers 20 mL/hr at 09/19/15 1900   PRN Meds:.sodium chloride, lactated ringers, metoprolol, midazolam, morphine injection, ondansetron (ZOFRAN) IV, oxyCODONE, sodium chloride, traMADol  General appearance: alert, cooperative and no distress Neurologic: intact Heart: regular rate and rhythm, S1, S2 normal, no murmur, click, rub or gallop Lungs: diminished breath sounds bibasilar Abdomen: soft, non-tender; bowel sounds normal; no masses,  no organomegaly Extremities: extremities normal, atraumatic, no cyanosis or edema and Homans sign is negative, no sign of DVT Wound: sternum stable  Lab Results: CBC: Recent Labs  09/21/15 0400 09/22/15 0400  WBC 15.1* 8.4  HGB 11.2* 9.7*  HCT 32.9* 29.0*  PLT 145* 143*   BMET:  Recent Labs  09/21/15 0400 09/22/15 0400  NA 135 138  K 4.2 3.9  CL 99* 99*  CO2 28 30  GLUCOSE 126* 71  BUN 13 19  CREATININE 0.69 0.67  CALCIUM 8.5* 8.3*    PT/INR:  Recent Labs  09/19/15 1515  LABPROT 17.1*  INR 1.38   Radiology: Dg Chest 2 View  09/22/2015   CLINICAL DATA:  Status post left chest tube removal  EXAM: CHEST  2 VIEW  COMPARISON:  Portable chest x-ray of September 21, 2015  FINDINGS: The lungs are mildly hypoinflated. There is bibasilar atelectasis and small amounts of pleural fluid. There is no pneumothorax. The cardiac silhouette is enlarged. There are post CABG changes. The pulmonary vascularity is not engorged. The right internal jugular venous catheters have their tips overlying the proximal and midportion of the SVC.  IMPRESSION: Interval  removal of the left chest tube without development of a pneumothorax or large pleural effusion. There is persistent bibasilar atelectasis with small bilateral pleural effusions. Improvement in the pulmonary interstitium. Persistent enlargement of the cardiac silhouette.   Electronically Signed   By: David  [place] M.D.   On: 09/22/2015 07:25     Assessment/Plan: S/P Procedure(s) (LRB): CORONARY ARTERY BYPASS GRAFTING (CABG) (N/A) TRANSESOPHAGEAL ECHOCARDIOGRAM (TEE) (N/A) Mobilize Diuresis Diabetes control Plan for transfer to step-down: see transfer orders     [name] 09/22/2015 7:52 AM

## 2015-09-22 NOTE — Progress Notes (Signed)
Inpatient Diabetes Program Recommendations  AACE/ADA: New Consensus Statement on Inpatient Glycemic Control (2015)  Target Ranges:  Prepandial:   less than 140 mg/dL      Peak postprandial:   less than 180 mg/dL (1-2 hours)      Critically ill patients:  140 - 180 mg/dL   Review of Glycemic Control:  Results for PASCO, MARCHITTO (MRN [id number]) as of 09/22/2015 12:56  Ref. Range 09/22/2015 03:54 09/22/2015 08:20 09/22/2015 11:51  Glucose-Capillary Latest Ref Range: 65-99 mg/dL 70 244 (H) 010 (H)  Results for LUCILLE, CRICHLOW (MRN [id number]) as of 09/22/2015 12:56  Ref. Range 09/19/2015 03:20  Hemoglobin A1C Latest Ref Range: 4.8-5.6 % 9.8 (H)   Inpatient Diabetes Program Recommendations:    Note patient restarted on oral diabetes agents today.  A1C indicates poorly controlled diabetes.  May need insulin at discharge based on A1C.  Will follow and recheck on 10/7 to see how patient does with oral agents.  May need low cost insulin such as NPH at d/c due to cost.   Will follow.  Beryl Meager, RN, BC-ADM Inpatient Diabetes Coordinator Pager [phone] (8a-5p)

## 2015-09-23 LAB — CBC
HCT: 29.8 % — ABNORMAL LOW (ref 39.0–52.0)
Hemoglobin: 9.9 g/dL — ABNORMAL LOW (ref 13.0–17.0)
MCH: 28.4 pg (ref 26.0–34.0)
MCHC: 33.2 g/dL (ref 30.0–36.0)
MCV: 85.4 fL (ref 78.0–100.0)
Platelets: 206 10*3/uL (ref 150–400)
RBC: 3.49 MIL/uL — ABNORMAL LOW (ref 4.22–5.81)
RDW: 13.8 % (ref 11.5–15.5)
WBC: 7.9 10*3/uL (ref 4.0–10.5)

## 2015-09-23 LAB — BASIC METABOLIC PANEL
Anion gap: 11 (ref 5–15)
BUN: 16 mg/dL (ref 6–20)
CO2: 30 mmol/L (ref 22–32)
Calcium: 8.4 mg/dL — ABNORMAL LOW (ref 8.9–10.3)
Chloride: 95 mmol/L — ABNORMAL LOW (ref 101–111)
Creatinine, Ser: 0.68 mg/dL (ref 0.61–1.24)
GFR calc Af Amer: >60 mL/min (ref >60)
GFR calc non Af Amer: >60 mL/min (ref >60)
Glucose, Bld: 160 mg/dL — ABNORMAL HIGH (ref 65–99)
Potassium: 4.2 mmol/L (ref 3.5–5.1)
Sodium: 136 mmol/L (ref 135–145)

## 2015-09-23 LAB — GLUCOSE, CAPILLARY
GLUCOSE-CAPILLARY: 209 mg/dL — AB (ref 65–99)
GLUCOSE-CAPILLARY: 102 mg/dL — AB (ref 65–99)
Glucose-Capillary: 266 mg/dL — ABNORMAL HIGH (ref 65–99)
Glucose-Capillary: 213 mg/dL — ABNORMAL HIGH (ref 65–99)

## 2015-09-23 MED ORDER — INSULIN DETEMIR 100 UNIT/ML ~~LOC~~ SOLN
5.0000 [IU] | Freq: Every day | SUBCUTANEOUS | Status: DC
  Administered 2015-09-24: 5 [IU] via SUBCUTANEOUS
  Filled 2015-09-23 (×3): qty 0.05

## 2015-09-23 MED ORDER — INSULIN STARTER KIT- SYRINGES (ENGLISH)
1.0000 | Freq: Once | Status: AC
  Administered 2015-09-23: 1
  Filled 2015-09-23: qty 1

## 2015-09-23 MED ORDER — INSULIN DETEMIR 100 UNIT/ML ~~LOC~~ SOLN
15.0000 [IU] | Freq: Every day | SUBCUTANEOUS | Status: DC
  Filled 2015-09-23: qty 0.15

## 2015-09-23 MED ORDER — LIVING WELL WITH DIABETES BOOK
Freq: Once | Status: AC
  Administered 2015-09-23: 14:00:00
  Filled 2015-09-23: qty 1

## 2015-09-23 NOTE — Progress Notes (Addendum)
Inpatient Diabetes Program Recommendations  AACE/ADA: New Consensus Statement on Inpatient Glycemic Control (2015)  Target Ranges:  Prepandial:   less than 140 mg/dL      Peak postprandial:   less than 180 mg/dL (1-2 hours)      Critically ill patients:  140 - 180 mg/dL   Review of Glycemic Control:Results for Dustin Rodriguez, Dustin Rodriguez (MRN [id number]) as of 09/23/2015 11:58  Ref. Range 09/22/2015 11:51 09/22/2015 16:52 09/22/2015 21:51 09/23/2015 07:58 09/23/2015 11:19  Glucose-Capillary Latest Ref Range: 65-99 mg/dL 782 (H) 87 956 (H) 213 (H) 209 (H)     Results for Dustin Rodriguez, Dustin Rodriguez (MRN [id number]) as of 09/23/2015 11:58  Ref. Range 09/19/2015 03:20  Hemoglobin A1C Latest Ref Range: 4.8-5.6 % 9.8 (H)   Current orders for Inpatient glycemic control: Amaryl 4 mg bid, Metformin 1000 mg bid  Inpatient Diabetes Program Recommendations:   Blood sugars have trended up. Please consider restarting Levemir 15 units daily.  According to case manager's note, patient does have orange card which will allow him to get insulin and medications at a reduced price.    Will discuss with patient today.  Thanks, Beryl Meager, RN, BC-ADM Inpatient Diabetes Coordinator Pager [phone] (8a-5p)    1:45 p Spoke with patient and wife regarding A1C results and potential need for insulin at discharge.  Gave them handout regarding A1C's meaning, standards of care, and diabetes basics.  They had several questions regarding diet.  Will order "Living well with Diabetes" booklet.  Will also ask nursing to show patient and wife diabetes videos via patient education network.  They are agreeable to insulin at discharge and state that they are currently working on getting Medicaid.  Will need close follow-up with PCP. Thanks, Beryl Meager, RN, BC-ADM Inpatient Diabetes Coordinator Pager [phone] (8a-5p)

## 2015-09-23 NOTE — Progress Notes (Signed)
Ref: Pola Corn, MD   Subjective:  Feeling better. Reminded patient to take deeper breaths.  Objective:  Vital Signs in the last 24 hours: Temp:  [97.7 F (36.5 C)-99 F (37.2 C)] 98.9 F (37.2 C) (10/07 1025) Pulse Rate:  [89-105] 94 (10/07 1025) Cardiac Rhythm:  [-] Normal sinus rhythm (10/07 1040) Resp:  [10-35] 22 (10/07 1025) BP: (91-134)/(51-71) 112/71 mmHg (10/07 1025) SpO2:  [93 %-100 %] 94 % (10/07 1025) Weight:  [71.668 kg (158 lb)-72.1 kg (158 lb 15.2 oz)] 72.1 kg (158 lb 15.2 oz) (10/07 1025)  Physical Exam: BP Readings from Last 1 Encounters:  09/23/15 112/71    Wt Readings from Last 1 Encounters:  09/23/15 72.1 kg (158 lb 15.2 oz)    Weight change: -0.832 kg (-1 lb 13.3 oz)  HEENT: [place]/AT, Eyes-Brown, PERL, EOMI, Conjunctiva-Pink, Sclera-Non-icteric Neck: No JVD, No bruit, Trachea midline. Lungs:  Clear, Bilateral. Midline scar. Cardiac:  Regular rhythm, normal S1 and S2, no S3. II/VI systolic murmur. Abdomen:  Soft, non-tender. Extremities:  No edema present. No cyanosis. + clubbing. CNS: AxOx3, Cranial nerves grossly intact, moves all 4 extremities. Right handed. Skin: Warm and dry.   Intake/Output from previous day: 10/06 0701 - 10/07 0700 In: 283 [P.O.:240; I.V.:43] Out: 475 [Urine:475]    Lab Results: BMET    Component Value Date/Time   NA 136 09/23/2015 0300   NA 138 09/22/2015 0400   NA 135 09/21/2015 0400   K 4.2 09/23/2015 0300   K 3.9 09/22/2015 0400   K 4.2 09/21/2015 0400   CL 95* 09/23/2015 0300   CL 99* 09/22/2015 0400   CL 99* 09/21/2015 0400   CO2 30 09/23/2015 0300   CO2 30 09/22/2015 0400   CO2 28 09/21/2015 0400   GLUCOSE 160* 09/23/2015 0300   GLUCOSE 71 09/22/2015 0400   GLUCOSE 126* 09/21/2015 0400   BUN 16 09/23/2015 0300   BUN 19 09/22/2015 0400   BUN 13 09/21/2015 0400   CREATININE 0.68 09/23/2015 0300   CREATININE 0.67 09/22/2015 0400   CREATININE 0.69 09/21/2015 0400   CALCIUM 8.4* 09/23/2015 0300   CALCIUM 8.3* 09/22/2015 0400   CALCIUM 8.5* 09/21/2015 0400   GFRNONAA >60 09/23/2015 0300   GFRNONAA >60 09/22/2015 0400   GFRNONAA >60 09/21/2015 0400   GFRAA >60 09/23/2015 0300   GFRAA >60 09/22/2015 0400   GFRAA >60 09/21/2015 0400   CBC    Component Value Date/Time   WBC 7.9 09/23/2015 0300   RBC 3.49* 09/23/2015 0300   HGB 9.9* 09/23/2015 0300   HCT 29.8* 09/23/2015 0300   PLT 206 09/23/2015 0300   MCV 85.4 09/23/2015 0300   MCH 28.4 09/23/2015 0300   MCHC 33.2 09/23/2015 0300   RDW 13.8 09/23/2015 0300   HEPATIC Function Panel  Recent Labs  09/17/15 0151 09/18/15 0306  PROT 6.0* 6.3*   HEMOGLOBIN A1C No components found for: HGA1C,  MPG CARDIAC ENZYMES Lab Results  Component Value Date   TROPONINI 1.08* 09/20/2015   TROPONINI 1.48* 09/20/2015   TROPONINI 1.65* 09/20/2015   BNP No results for input(s): PROBNP in the last 8760 hours. TSH No results for input(s): TSH in the last 8760 hours. CHOLESTEROL No results for input(s): CHOL in the last 8760 hours.  Scheduled Meds: . antiseptic oral rinse  7 mL Mouth Rinse BID  . aspirin EC  325 mg Oral Daily  . atorvastatin  80 mg Oral q1800  . carvedilol  6.25 mg Oral BID WC  .  docusate sodium  200 mg Oral Daily  . enoxaparin (LOVENOX) injection  30 mg Subcutaneous Q24H  . furosemide  20 mg Oral Daily  . glimepiride  4 mg Oral BID WC  . insulin aspart  0-24 Units Subcutaneous TID AC & HS  . isosorbide mononitrate  30 mg Oral Daily  . levalbuterol  0.63 mg Nebulization TID  . lisinopril  2.5 mg Oral Daily  . metFORMIN  1,000 mg Oral BID WC  . pantoprazole  40 mg Oral QAC breakfast  . potassium chloride  10 mEq Oral Daily  . sodium chloride  3 mL Intravenous Q12H   Continuous Infusions:  PRN Meds:.sodium chloride, acetaminophen, bisacodyl **OR** bisacodyl, guaiFENesin, ondansetron **OR** ondansetron (ZOFRAN) IV, oxyCODONE, sodium chloride, traMADol  Assessment/Plan: Severe 3 vessel CAD with LV systolic  dysfunction CABG-5 vessel DM, II Dyslipidemia Tobacco use disorder Anxiety  Resume Levemir insulin     LOS: 7 days    Dustin Cobb  MD  09/23/2015, 1:40 PM

## 2015-09-23 NOTE — Progress Notes (Signed)
CARDIAC REHAB PHASE I   PRE:  Rate/Rhythm: 105 ST  BP:  Supine:   Sitting: 102/47  Standing:    SaO2: 90%RA  MODE:  Ambulation: 550 ft   POST:  Rate/Rhythm: 108 ST  BP:  Supine:   Sitting: 121/49  Standing:    SaO2: 91-92%RA 1345-1419 Pt walked 550 ft on RA with rolling walker and asst x 1. No complaints except sleepy. Tolerated well. To sitting on side of bed after walk. Encouraged IS and flutter valve.   Luetta Nutting, RN BSN  09/23/2015 2:16 PM

## 2015-09-23 NOTE — Progress Notes (Signed)
09/23/2015 1015 Received pt to room 24 from 2S.  Pt is A&O, no c/o voiced, just states he is sleepy.  Tele applied and CCMD notified.  Oriented to room, call light and bed.  Call bell in reach, family at bedside. Kathryne Hitch

## 2015-09-23 NOTE — Plan of Care (Signed)
Problem: Limited Adherence to Nutrition-Related Recommendations (NB-1.6) Goal: Nutrition education Formal process to instruct or train a patient/client in a skill or to impart knowledge to help patients/clients voluntarily manage or modify food choices and eating behavior to maintain or improve health. Outcome: Completed/Met Date Met:  09/23/15  RD consulted for nutrition education regarding diabetes.  Pt sleeping soundly upon visit.  Education provided to patient's wife.    Lab Results  Component Value Date    HGBA1C 9.8* 09/19/2015    RD provided "Carbohydrate Counting for People with Diabetes" handout from the Academy of [employer] Dietetics. Discussed different food groups and their effects on blood sugar, emphasizing carbohydrate-containing foods. Provided list of carbohydrates and recommended serving sizes of common foods.  Discussed importance of controlled and consistent carbohydrate intake throughout the day. Provided examples of ways to balance meals/snacks and encouraged intake of high-fiber, whole grain complex carbohydrates. Teach back method used.  Expect fair compliance.  Body mass index is 29.07 kg/(m^2). Pt meets criteria for Overweight based on current BMI.  Current diet order is Carbohydrate Modified/Heart Healthy.  Labs and medications reviewed. No further nutrition interventions warranted at this time. If additional nutrition issues arise, please re-consult RD.  Arthur Holms, RD, LDN Pager #: [phone] After-Hours Pager #: [phone]

## 2015-09-23 NOTE — Progress Notes (Signed)
Patient ID: Dustin Rodriguez, male   DOB: [birth year], 59 y.o.   MRN: [id number]      [address]       [employer] [address]             [phone]                 4 Days Post-Op Procedure(s) (LRB): CORONARY ARTERY BYPASS GRAFTING (CABG) (N/A) TRANSESOPHAGEAL ECHOCARDIOGRAM (TEE) (N/A)  LOS: 7 days   Subjective: Feels better today, ambulating well  Objective: Vital signs in last 24 hours: Patient Vitals for the past 24 hrs:  BP Temp Temp src Pulse Resp SpO2 Weight  09/23/15 1433 - - - - - 93 % -  09/23/15 1410 (!) 121/49 mmHg 98.7 F (37.1 C) Oral (!) 101 20 93 % -  09/23/15 1025 112/71 mmHg 98.9 F (37.2 C) Oral 94 (!) 22 94 % 158 lb 15.2 oz (72.1 kg)  09/23/15 0934 134/65 mmHg - - - - - -  09/23/15 0914 - - - - - 93 % -  09/23/15 0900 - - - - (!) 33 - -  09/23/15 0800 - 97.9 F (36.6 C) Oral - - - -  09/23/15 0753 134/65 mmHg - - (!) 104 - - -  09/23/15 0700 134/65 mmHg - - 100 20 95 % -  09/23/15 0600 (!) 110/59 mmHg - - 98 (!) 35 97 % 158 lb (71.668 kg)  09/23/15 0500 118/66 mmHg - - 99 (!) 21 95 % -  09/23/15 0400 133/67 mmHg - - 97 (!) 34 95 % -  09/23/15 0300 (!) 118/57 mmHg - - 94 (!) 34 95 % -  09/23/15 0200 (!) 111/59 mmHg - - 95 (!) 25 95 % -  09/23/15 0100 122/60 mmHg - - 97 (!) 34 93 % -  09/23/15 0000 (!) 105/53 mmHg - - 95 (!) 27 94 % -  09/22/15 2100 (!) 120/57 mmHg - - 93 (!) 26 100 % -  09/22/15 2000 (!) 102/55 mmHg - - 89 10 96 % -  09/22/15 1936 - 99 F (37.2 C) Oral - - - -  09/22/15 1900 (!) 91/54 mmHg - - 94 (!) 26 95 % -  09/22/15 1800 121/61 mmHg - - (!) 102 20 95 % -  09/22/15 1700 119/61 mmHg - - (!) 105 (!) 25 95 % -  09/22/15 1600 (!) 101/55 mmHg 97.7 F (36.5 C) Oral - (!) 34 94 % -    Filed Weights   09/22/15 0600 09/23/15 0600 09/23/15 1025  Weight: 159 lb 13.3 oz (72.5 kg) 158 lb (71.668 kg) 158 lb 15.2 oz (72.1 kg)    Hemodynamic parameters for last 24 hours:    Intake/Output from previous day: 10/06 0701 - 10/07  0700 In: 283 [P.O.:240; I.V.:43] Out: 475 [Urine:475] Intake/Output this shift: Total I/O In: 240 [P.O.:240] Out: 200 [Urine:200]  Scheduled Meds: . antiseptic oral rinse  7 mL Mouth Rinse BID  . aspirin EC  325 mg Oral Daily  . atorvastatin  80 mg Oral q1800  . carvedilol  6.25 mg Oral BID WC  . docusate sodium  200 mg Oral Daily  . enoxaparin (LOVENOX) injection  30 mg Subcutaneous Q24H  . furosemide  20 mg Oral Daily  . glimepiride  4 mg Oral BID WC  . insulin aspart  0-24 Units Subcutaneous TID AC & HS  . insulin detemir  15 Units Subcutaneous QHS  . insulin starter  kit- syringes  1 kit Other Once  . isosorbide mononitrate  30 mg Oral Daily  . levalbuterol  0.63 mg Nebulization TID  . lisinopril  2.5 mg Oral Daily  . living well with diabetes book   Does not apply Once  . metFORMIN  1,000 mg Oral BID WC  . pantoprazole  40 mg Oral QAC breakfast  . potassium chloride  10 mEq Oral Daily  . sodium chloride  3 mL Intravenous Q12H   Continuous Infusions:  PRN Meds:.sodium chloride, acetaminophen, bisacodyl **OR** bisacodyl, guaiFENesin, ondansetron **OR** ondansetron (ZOFRAN) IV, oxyCODONE, sodium chloride, traMADol  General appearance: alert and cooperative Neurologic: intact Heart: regular rate and rhythm, S1, S2 normal, no murmur, click, rub or gallop Lungs: clear to auscultation bilaterally Abdomen: soft, non-tender; bowel sounds normal; no masses,  no organomegaly Extremities: extremities normal, atraumatic, no cyanosis or edema and Homans sign is negative, no sign of DVT Wound: sternum stable  Lab Results: CBC: Recent Labs  09/22/15 0400 09/23/15 0300  WBC 8.4 7.9  HGB 9.7* 9.9*  HCT 29.0* 29.8*  PLT 143* 206   BMET:  Recent Labs  09/22/15 0400 09/23/15 0300  NA 138 136  K 3.9 4.2  CL 99* 95*  CO2 30 30  GLUCOSE 71 160*  BUN 19 16  CREATININE 0.67 0.68  CALCIUM 8.3* 8.4*    PT/INR: No results for input(s): LABPROT, INR in the last 72  hours.   Radiology Dg Chest 2 View  09/22/2015   CLINICAL DATA:  Status post left chest tube removal  EXAM: CHEST  2 VIEW  COMPARISON:  Portable chest x-ray of September 21, 2015  FINDINGS: The lungs are mildly hypoinflated. There is bibasilar atelectasis and small amounts of pleural fluid. There is no pneumothorax. The cardiac silhouette is enlarged. There are post CABG changes. The pulmonary vascularity is not engorged. The right internal jugular venous catheters have their tips overlying the proximal and midportion of the SVC.  IMPRESSION: Interval removal of the left chest tube without development of a pneumothorax or large pleural effusion. There is persistent bibasilar atelectasis with small bilateral pleural effusions. Improvement in the pulmonary interstitium. Persistent enlargement of the cardiac silhouette.   Electronically Signed   By: David  Martinique M.D.   On: 09/22/2015 07:25     Assessment/Plan: S/P Procedure(s) (LRB): CORONARY ARTERY BYPASS GRAFTING (CABG) (N/A) TRANSESOPHAGEAL ECHOCARDIOGRAM (TEE) (N/A) Waking well, stable medicine restarted  Insulin Poss home on sunday   [name] 09/23/2015 3:36 PM

## 2015-09-24 LAB — GLUCOSE, CAPILLARY
GLUCOSE-CAPILLARY: 90 mg/dL (ref 65–99)
GLUCOSE-CAPILLARY: 199 mg/dL — AB (ref 65–99)
Glucose-Capillary: 272 mg/dL — ABNORMAL HIGH (ref 65–99)
Glucose-Capillary: 205 mg/dL — ABNORMAL HIGH (ref 65–99)

## 2015-09-24 LAB — CBC
HCT: 30 % — ABNORMAL LOW (ref 39.0–52.0)
Hemoglobin: 10.1 g/dL — ABNORMAL LOW (ref 13.0–17.0)
MCH: 28.9 pg (ref 26.0–34.0)
MCHC: 33.7 g/dL (ref 30.0–36.0)
MCV: 85.7 fL (ref 78.0–100.0)
Platelets: 247 10*3/uL (ref 150–400)
RBC: 3.5 MIL/uL — ABNORMAL LOW (ref 4.22–5.81)
RDW: 13.9 % (ref 11.5–15.5)
WBC: 7.5 10*3/uL (ref 4.0–10.5)

## 2015-09-24 LAB — BASIC METABOLIC PANEL
Anion gap: 11 (ref 5–15)
BUN: 15 mg/dL (ref 6–20)
CO2: 29 mmol/L (ref 22–32)
Calcium: 8.8 mg/dL — ABNORMAL LOW (ref 8.9–10.3)
Chloride: 97 mmol/L — ABNORMAL LOW (ref 101–111)
Creatinine, Ser: 0.79 mg/dL (ref 0.61–1.24)
GFR calc Af Amer: >60 mL/min (ref >60)
GFR calc non Af Amer: >60 mL/min (ref >60)
Glucose, Bld: 190 mg/dL — ABNORMAL HIGH (ref 65–99)
Potassium: 4.5 mmol/L (ref 3.5–5.1)
Sodium: 137 mmol/L (ref 135–145)

## 2015-09-24 MED ORDER — CARVEDILOL 12.5 MG PO TABS
12.5000 mg | ORAL_TABLET | Freq: Two times a day (BID) | ORAL | Status: DC
  Administered 2015-09-24 – 2015-09-26 (×4): 12.5 mg via ORAL
  Filled 2015-09-24 (×4): qty 1

## 2015-09-24 NOTE — Progress Notes (Signed)
CARDIAC REHAB PHASE I   PRE:  Rate/Rhythm: Sinus Tach 101  BP:  Supine:   Sitting:  Standing:    SaO2:   MODE:  Ambulation: Patient and wife walking independantly   POST  BP:  [id number] Patient and wife  Given discharge open heart surgery information. Discussed Sternal precautions, exercise guidelines, temperature precautions and end points of exercise with the patient. Mr Townley is interested in phase 2 cardiac rehab. Permission granted by the patient to contact him at home. Mr Moone says he wants to quit smoking and was given smoking cessation information. Patient given the phone number for 1-800-quit-now. Reviewed risk factors and heart healthy guidelines with the patient and his wife. Patient and wife shown DC OHS video.  Cattaleya Wien, Arta Bruce RN BSN

## 2015-09-24 NOTE — Progress Notes (Signed)
Patient lying in bed, wife present at bedside. Call light within reach. 

## 2015-09-24 NOTE — Progress Notes (Signed)
Subjective:  Doing well denies any chest pain or shortness of breath ambulating in room without any problems  Objective:  Vital Signs in the last 24 hours: Temp:  [98 F (36.7 C)-98.8 F (37.1 C)] 98 F (36.7 C) (10/08 0524) Pulse Rate:  [96-101] 99 (10/08 0524) Resp:  [18-20] 18 (10/08 0524) BP: (121-156)/(49-79) 156/79 mmHg (10/08 0524) SpO2:  [91 %-96 %] 94 % (10/08 0845) Weight:  [71.6 kg (157 lb 13.6 oz)] 71.6 kg (157 lb 13.6 oz) (10/08 0524)  Intake/Output from previous day: 10/07 0701 - 10/08 0700 In: 480 [P.O.:480] Out: 200 [Urine:200] Intake/Output from this shift: Total I/O In: 360 [P.O.:360] Out: 300 [Urine:300]  Physical Exam: Neck: no adenopathy, no carotid bruit, no JVD and supple, symmetrical, trachea midline Lungs: Decreased breath sound at bases Heart: regular rate and rhythm, S1, S2 normal and Soft systolic murmur noted Abdomen: soft, non-tender; bowel sounds normal; no masses,  no organomegaly Extremities: extremities normal, atraumatic, no cyanosis or edema  Lab Results:  Recent Labs  09/23/15 0300 09/24/15 0324  WBC 7.9 7.5  HGB 9.9* 10.1*  PLT 206 247    Recent Labs  09/23/15 0300 09/24/15 0324  NA 136 137  K 4.2 4.5  CL 95* 97*  CO2 30 29  GLUCOSE 160* 190*  BUN 16 15  CREATININE 0.68 0.79   No results for input(s): TROPONINI in the last 72 hours.  Invalid input(s): CK, MB Hepatic Function Panel No results for input(s): PROT, ALBUMIN, AST, ALT, ALKPHOS, BILITOT, BILIDIR, IBILI in the last 72 hours. No results for input(s): CHOL in the last 72 hours. No results for input(s): PROTIME in the last 72 hours.  Imaging: Imaging results have been reviewed and No results found.  Cardiac Studies:  Assessme status postnt/Plan:  Severe three-vessel coronary artery disease with markedly depressed LV systolic function status post CABG 5 doing well History of silent MI in the past Ischemic cardiomyopathy Diabetes  mellitus Hypercholesteremia Tobacco abuse Strong family history of coronary artery disease Postop anemia stable Postop atelectasis improved Plan Increase. Beta Blockers and Ace inhibitors as tolerated Increase ambulation   LOS: 8 days    Rinaldo Cloud 09/24/2015, 1:14 PM

## 2015-09-24 NOTE — Progress Notes (Addendum)
[address]       [place] [address]             [phone]      5 Days Post-Op Procedure(s) (LRB): CORONARY ARTERY BYPASS GRAFTING (CABG) (N/A) TRANSESOPHAGEAL ECHOCARDIOGRAM (TEE) (N/A) Subjective: Sternal incis discomfort is only complaint    Objective: Vital signs in last 24 hours: Temp:  [98 F (36.7 C)-98.9 F (37.2 C)] 98 F (36.7 C) (10/08 0524) Pulse Rate:  [94-101] 99 (10/08 0524) Cardiac Rhythm:  [-] Sinus tachycardia (10/08 0700) Resp:  [18-33] 18 (10/08 0524) BP: (112-156)/(49-79) 156/79 mmHg (10/08 0524) SpO2:  [91 %-96 %] 94 % (10/08 0845) Weight:  [157 lb 13.6 oz (71.6 kg)-158 lb 15.2 oz (72.1 kg)] 157 lb 13.6 oz (71.6 kg) (10/08 0524)  Hemodynamic parameters for last 24 hours:    Intake/Output from previous day: 10/07 0701 - 10/08 0700 In: 480 [P.O.:480] Out: 200 [Urine:200] Intake/Output this shift: Total I/O In: 360 [P.O.:360] Out: 300 [Urine:300]  General appearance: alert, cooperative and no distress Heart: regular rate and rhythm Lungs: clear to auscultation bilaterally Abdomen: benign Extremities: Min edema Wound: incis healing well  Lab Results:  Recent Labs  09/23/15 0300 09/24/15 0324  WBC 7.9 7.5  HGB 9.9* 10.1*  HCT 29.8* 30.0*  PLT 206 247   BMET:  Recent Labs  09/23/15 0300 09/24/15 0324  NA 136 137  K 4.2 4.5  CL 95* 97*  CO2 30 29  GLUCOSE 160* 190*  BUN 16 15  CREATININE 0.68 0.79  CALCIUM 8.4* 8.8*    PT/INR: No results for input(s): LABPROT, INR in the last 72 hours. ABG    Component Value Date/Time   PHART 7.368 09/19/2015 2204   HCO3 25.1* 09/19/2015 2204   TCO2 23 09/20/2015 1602   ACIDBASEDEF 2.0 09/19/2015 1551   O2SAT 93.0 09/19/2015 2204   CBG (last 3)   Recent Labs  09/23/15 1641 09/23/15 2123 09/24/15 0627  GLUCAP 213* 102* 272*    Meds Scheduled Meds: . antiseptic oral rinse  7 mL Mouth Rinse BID  . aspirin EC  325 mg Oral Daily  . atorvastatin  80 mg  Oral q1800  . carvedilol  6.25 mg Oral BID WC  . docusate sodium  200 mg Oral Daily  . enoxaparin (LOVENOX) injection  30 mg Subcutaneous Q24H  . furosemide  20 mg Oral Daily  . glimepiride  4 mg Oral BID WC  . insulin aspart  0-24 Units Subcutaneous TID AC & HS  . insulin detemir  5 Units Subcutaneous QHS  . isosorbide mononitrate  30 mg Oral Daily  . levalbuterol  0.63 mg Nebulization TID  . lisinopril  2.5 mg Oral Daily  . metFORMIN  1,000 mg Oral BID WC  . pantoprazole  40 mg Oral QAC breakfast  . potassium chloride  10 mEq Oral Daily  . sodium chloride  3 mL Intravenous Q12H   Continuous Infusions:  PRN Meds:.sodium chloride, acetaminophen, bisacodyl **OR** bisacodyl, guaiFENesin, ondansetron **OR** ondansetron (ZOFRAN) IV, oxyCODONE, sodium chloride, traMADol  Xrays No results found.  Assessment/Plan: S/P Procedure(s) (LRB): CORONARY ARTERY BYPASS GRAFTING (CABG) (N/A) TRANSESOPHAGEAL ECHOCARDIOGRAM (TEE) (N/A)  1 overall doing well  2 d/c epw's  3 prob would benefit from insulin as outpatient 4 poss home in am 5 will increase beta blocker with tachy and pvc's   LOS: 8 days    GOLD,WAYNE E 09/24/2015   Chart reviewed, patient examined, agree with above.  Preop Hgb A1c was 9.9. He needs to go home on Levemir 15 units in addition to his po meds and will need outpatient follow up with PCP. He has been seeing Dr. Shana Chute who is retiring or retired and therefore needs a new PCP. He also needs strict dietary control which I think he has been non-compliant with.

## 2015-09-25 LAB — GLUCOSE, CAPILLARY
GLUCOSE-CAPILLARY: 177 mg/dL — AB (ref 65–99)
GLUCOSE-CAPILLARY: 95 mg/dL (ref 65–99)
GLUCOSE-CAPILLARY: 163 mg/dL — AB (ref 65–99)
Glucose-Capillary: 265 mg/dL — ABNORMAL HIGH (ref 65–99)

## 2015-09-25 MED ORDER — INSULIN DETEMIR 100 UNIT/ML ~~LOC~~ SOLN
15.0000 [IU] | Freq: Every day | SUBCUTANEOUS | Status: DC
  Administered 2015-09-25: 15 [IU] via SUBCUTANEOUS
  Filled 2015-09-25 (×2): qty 0.15

## 2015-09-25 MED ORDER — FUROSEMIDE 40 MG PO TABS
40.0000 mg | ORAL_TABLET | Freq: Every day | ORAL | Status: DC
  Administered 2015-09-26: 40 mg via ORAL
  Filled 2015-09-25: qty 1

## 2015-09-25 NOTE — Progress Notes (Addendum)
[address]       [place] [address]             [phone]      6 Days Post-Op Procedure(s) (LRB): CORONARY ARTERY BYPASS GRAFTING (CABG) (N/A) TRANSESOPHAGEAL ECHOCARDIOGRAM (TEE) (N/A) Subjective: Feels ok, some sternal incis pain  Objective: Vital signs in last 24 hours: Temp:  [98.5 F (36.9 C)-98.7 F (37.1 C)] 98.7 F (37.1 C) (10/09 0457) Pulse Rate:  [89-94] 94 (10/09 0457) Cardiac Rhythm:  [-] Normal sinus rhythm;Sinus tachycardia (10/08 2330) Resp:  [18] 18 (10/09 0457) BP: (115-130)/(60-72) 130/72 mmHg (10/09 0457) SpO2:  [94 %-98 %] 98 % (10/09 0457) Weight:  [156 lb 4.9 oz (70.9 kg)] 156 lb 4.9 oz (70.9 kg) (10/09 0457)  Hemodynamic parameters for last 24 hours:    Intake/Output from previous day: 10/08 0701 - 10/09 0700 In: 720 [P.O.:720] Out: 1700 [Urine:1700] Intake/Output this shift:    General appearance: alert, cooperative and no distress Heart: regular rate and rhythm Lungs: dim in left base Abdomen: benign Extremities: + edema R>L leg Wound: incis healing well  Lab Results:  Recent Labs  09/23/15 0300 09/24/15 0324  WBC 7.9 7.5  HGB 9.9* 10.1*  HCT 29.8* 30.0*  PLT 206 247   BMET:  Recent Labs  09/23/15 0300 09/24/15 0324  NA 136 137  K 4.2 4.5  CL 95* 97*  CO2 30 29  GLUCOSE 160* 190*  BUN 16 15  CREATININE 0.68 0.79  CALCIUM 8.4* 8.8*    PT/INR: No results for input(s): LABPROT, INR in the last 72 hours. ABG    Component Value Date/Time   PHART 7.368 09/19/2015 2204   HCO3 25.1* 09/19/2015 2204   TCO2 23 09/20/2015 1602   ACIDBASEDEF 2.0 09/19/2015 1551   O2SAT 93.0 09/19/2015 2204   CBG (last 3)   Recent Labs  09/24/15 1602 09/24/15 2117 09/25/15 0605  GLUCAP 90 199* 177*    Meds Scheduled Meds: . antiseptic oral rinse  7 mL Mouth Rinse BID  . aspirin EC  325 mg Oral Daily  . atorvastatin  80 mg Oral q1800  . carvedilol  12.5 mg Oral BID WC  . docusate sodium  200 mg Oral  Daily  . enoxaparin (LOVENOX) injection  30 mg Subcutaneous Q24H  . furosemide  20 mg Oral Daily  . glimepiride  4 mg Oral BID WC  . insulin aspart  0-24 Units Subcutaneous TID AC & HS  . insulin detemir  5 Units Subcutaneous QHS  . isosorbide mononitrate  30 mg Oral Daily  . lisinopril  2.5 mg Oral Daily  . metFORMIN  1,000 mg Oral BID WC  . pantoprazole  40 mg Oral QAC breakfast  . potassium chloride  10 mEq Oral Daily  . sodium chloride  3 mL Intravenous Q12H   Continuous Infusions:  PRN Meds:.sodium chloride, acetaminophen, bisacodyl **OR** bisacodyl, guaiFENesin, ondansetron **OR** ondansetron (ZOFRAN) IV, oxyCODONE, sodium chloride, traMADol  Xrays No results found.  Assessment/Plan: S/P Procedure(s) (LRB): CORONARY ARTERY BYPASS GRAFTING (CABG) (N/A) TRANSESOPHAGEAL ECHOCARDIOGRAM (TEE) (N/A)  1 steady progress 2 sinus with pvc's, BP controlled fairly well 3 sugars somewhat labile but mostly high- will have him work with nurse giving  Insulin and cont working with diabetic coordinator. Dr Shana Chute is still practicing but he knows he will need new primary care provider at some point.  4 He has some issues he wants to resolve with social work prior to d/c tomorrow  LOS: 9 days    Dustin Rodriguez,Dustin Rodriguez 09/25/2015   Chart reviewed, patient examined, agree with above.

## 2015-09-25 NOTE — Progress Notes (Signed)
Pt's wife drew up insulin and injected her husband twice with out any problems at all

## 2015-09-25 NOTE — Discharge Summary (Signed)
Physician Discharge Summary  Patient ID: Dustin Rodriguez MRN: [id number] DOB/AGE: 58/25/1957 58 y.o.  Admit date: 09/16/2015 Discharge date: 09/25/2015  Admission Diagnoses: Chest pain  Discharge Diagnoses:  Principal Problem:   Chest pain at rest Active Problems:   Acute coronary syndrome (HCC)   S/P CABG x 5  Patient Active Problem List   Diagnosis Date Noted  . 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 Chest pain at rest 09/16/2015  . Acute coronary syndrome (HCC) 09/16/2015   Hypercholesterolemia Type 2 diabetes mellitus Chronic lower back pain Tobacco abuse  History of present illness: At time of consultation Dustin Rodriguez is a 59 yo male with history of DM, Hyperlipidemia, and smoking history of 2ppd for 20 years. He states he had not been seen by a physician in 2 years. He presented to his PCP with complaints of fatigue. Workup included EKG which had changes likely indicating an old myocardial infarction. It was felt stress test should be done as well as echocardiogram. These showed cardiomyopathy with an EF of 25% and positive evidence of ischemia. Due to this he was referred to Dr. Algie Coffer for further workup. He felt the patient should undergo cardiac catheterization for further workup. This was done today and showed severe 3 vessel CAD. Cardiac Surgery was consulted. He states he feels fatigued which he attributes to working late at night. He denies chest pain, palpitations, and shortness of breath. He states that after he eats a large meal he gets a "stress" feeling along his epigastrium. The patient does not seem to fully understand the severity of his CAD despite extensive counseling by Dr. Algie Coffer.    Discharged Condition: good  Hospital Course: Following cardiac catheterization the patient was then scheduled for surgical revascularization to be done by Dr. Sheliah Plane. He continued to remain medically stable to proceed and on 09/19/2015 he was taken the operating room at which  time he underwent the following procedure:   DATE OF PROCEDURE: 09/19/2015 DATE OF DISCHARGE:   OPERATIVE REPORT   PREOPERATIVE DIAGNOSIS: Three-vessel coronary artery disease with severe LV dysfunction, and poorly controlled diabetes.  POSTOPERATIVE DIAGNOSIS: Three-vessel coronary artery disease with severe LV dysfunction, and poorly controlled diabetes.  SURGICAL PROCEDURE: Coronary artery bypass grafting x5, with the left internal mammary to the left anterior descending coronary artery, sequential reverse saphenous vein graft to the diagonal coronary artery and intermediate coronary artery, reverse saphenous vein graft to the circumflex coronary artery, reverse saphenous vein graft to the distal right coronary artery with right leg greater saphenous thigh and calf endoscopic harvesting.Placement of Right femoral A line.  SURGEON: Sheliah Plane, MD  FIRST ASSISTANT: Jennye Moccasin, PA. He tolerated the procedure well was taken to the surgical intensive care unit in stable condition.  Postoperative hospital course:  Overall the patient has progressed nicely. He had an expected acute blood loss anemia which stabilized. All routine lines, monitors and drainage devices have been discontinued in the standard fashion. He has some postoperative volume overload but is responding to diuretics. His diabetes has been under fair control but it is clear from his preoperative hemoglobin A1c level of greater than 9 that he will require insulin as an outpatient in addition to his oral regimen. Incisions are noted be healing well without evidence of infection. He is tolerating diet and tip these commensurate with postoperative level of convalescence. Currently his status is felt to be tentatively stable for discharge in the next 24-48 hours pending ongoing reevaluation of his recovery.  Consults: diabetes educator  Significant Diagnostic Studies: angiography:  cardiac cath  Treatments: surgery: as above  Discharge Exam: Blood pressure 130/72, pulse 94, temperature 98.7 F (37.1 C), temperature source Oral, resp. rate 18, height  (1.575 m), weight 156 lb 4.9 oz (70.9 kg), SpO2 98 %.   General appearance: alert, cooperative and no distress Heart: regular rate and rhythm Lungs: clear to auscultation bilaterally Abdomen: benign Extremities: + right>left LE edema Wound: incis healing well  Disposition: Final discharge disposition not confirmed  Discharge Instructions    Amb Referral to Cardiac Rehabilitation    Complete by:  As directed   Congestive Heart Failure: If diagnosis is Heart Failure, patient MUST meet each of the CMS criteria: 1. Left Ventricular Ejection Fraction </= 35% 2. NYHA class II-IV symptoms despite being on optimal heart failure therapy for at least 6 weeks. 3. Stable = have not had a recent (<6 weeks) or planned (<6 months) major cardiovascular hospitalization or procedure  Program Details: - Physician supervised classes - 1-3 classes per week over a 12-18 week period, generally for a total of 36 sessions  Physician Certification: I certify that the above Cardiac Rehabilitation treatment is medically necessary and is medically approved by me for treatment of this patient. The patient is willing and cooperative, able to ambulate and medically stable to participate in exercise rehabilitation. The participant's progress and Individualized Treatment Plan will be reviewed by the Medical Director, Cardiac Rehab staff and as indicated by the Referring/Ordering Physician.  Diagnosis:  CABG                    Follow-up Information    Follow up with [name], MD.   Specialty:  Cardiothoracic Surgery   Why:  The office will contact you for appointment in 4 weeks. Please obtain a chest x-ray one half hour prior to this appointment and [facility] imaging. [facility] is located in the same office  complex.   Contact information:   [address] [employer] [address] [place] [address] [phone]       Follow up with Ricki Rodriguez, MD.   Specialty:  Cardiology   Why:  Call to arrange appointment in 2 weeks.   Contact information:   [address] [place] [address] [phone]       Follow up with Pola Corn, MD.   Specialty:  Cardiology   Why:  Please arrange for an appointment to see her primary care doctor in 1-2 weeks to follow up with diabetes management.   Contact information:   [address] [place] [address] [phone]      The patient has been discharged on:   Medication List    TAKE these medications        aspirin 325 MG EC tablet  Take 1 tablet (325 mg total) by mouth daily.     atorvastatin 80 MG tablet  Commonly known as:  LIPITOR  Take 1 tablet (80 mg total) by mouth daily at 6 PM.     carvedilol 12.5 MG tablet  Commonly known as:  COREG  Take 1 tablet (12.5 mg total) by mouth 2 (two) times daily with a meal.     furosemide 40 MG tablet  Commonly known as:  LASIX  Take 1 tablet (40 mg total) by mouth daily.     glimepiride 4 MG tablet  Commonly known as:  AMARYL  Take 4 mg by mouth 2 (two) times daily.  insulin detemir 100 UNIT/ML injection  Commonly known as:  LEVEMIR  Inject 0.2 mLs (20 Units total) into the skin at bedtime.     isosorbide mononitrate 30 MG 24 hr tablet  Commonly known as:  IMDUR  Take 1 tablet (30 mg total) by mouth daily.     lisinopril 2.5 MG tablet  Commonly known as:  PRINIVIL,ZESTRIL  Take 1 tablet (2.5 mg total) by mouth daily.     metFORMIN 1000 MG tablet  Commonly known as:  GLUCOPHAGE  Take 1,000 mg by mouth 2 (two) times daily with a meal.     oxyCODONE 5 MG immediate release tablet  Commonly known as:  Oxy IR/ROXICODONE  Take 1-2 tablets (5-10 mg total) by mouth every 4 (four) hours as needed for moderate pain or severe pain.     potassium chloride 10 MEQ tablet   Commonly known as:  K-DUR,KLOR-CON  Take 1 tablet (10 mEq total) by mouth daily.        1.Beta Blocker:  Yes [ y  ]                              No   [   ]                              If No, reason:  2.Ace Inhibitor/ARB: Yes [  y ]                                     No  [    ]                                     If No, reason:  3.Statin:   Yes [ y  ]                  No  [   ]                  If No, reason:  4.Ecasa:  Yes  [  y ]                  No   [   ]                  If No, reason:  Signed: GOLD,WAYNE E 09/25/2015, 12:03 PM

## 2015-09-25 NOTE — Progress Notes (Signed)
Subjective:  Complains of musculoskeletal pain and leg swelling. Denies any anginal chest pain.  Objective:  Vital Signs in the last 24 hours: Temp:  [98.5 F (36.9 C)-98.7 F (37.1 C)] 98.7 F (37.1 C) (10/09 0457) Pulse Rate:  [89-94] 94 (10/09 0457) Resp:  [18] 18 (10/09 0457) BP: (115-130)/(60-72) 130/72 mmHg (10/09 0457) SpO2:  [94 %-98 %] 98 % (10/09 0457) Weight:  [70.9 kg (156 lb 4.9 oz)] 70.9 kg (156 lb 4.9 oz) (10/09 0457)  Intake/Output from previous day: 10/08 0701 - 10/09 0700 In: 720 [P.O.:720] Out: 1700 [Urine:1700] Intake/Output from this shift: Total I/O In: 360 [P.O.:360] Out: 400 [Urine:400]  Physical Exam: Neck: no adenopathy, no carotid bruit, no JVD and supple, symmetrical, trachea midline Lungs: Decreased breath sound at bases Heart: regular rate and rhythm, S1, S2 normal and Soft systolic murmur noted Abdomen: soft, non-tender; bowel sounds normal; no masses,  no organomegaly Extremities: No clubbing cyanosis 1+ edema noted  Lab Results:  Recent Labs  09/23/15 0300 09/24/15 0324  WBC 7.9 7.5  HGB 9.9* 10.1*  PLT 206 247    Recent Labs  09/23/15 0300 09/24/15 0324  NA 136 137  K 4.2 4.5  CL 95* 97*  CO2 30 29  GLUCOSE 160* 190*  BUN 16 15  CREATININE 0.68 0.79   No results for input(s): TROPONINI in the last 72 hours.  Invalid input(s): CK, MB Hepatic Function Panel No results for input(s): PROT, ALBUMIN, AST, ALT, ALKPHOS, BILITOT, BILIDIR, IBILI in the last 72 hours. No results for input(s): CHOL in the last 72 hours. No results for input(s): PROTIME in the last 72 hours.  Imaging: Imaging results have been reviewed and No results found.  Cardiac Studies:  Assessment/Plan:  Severe three-vessel coronary artery disease with markedly depressed LV systolic function status post CABG 5 doing well History of silent MI in the past Ischemic cardiomyopathy Diabetes mellitus Hypercholesteremia Tobacco abuse Strong family history  of coronary artery disease Postop anemia stable Postop atelectasis improved Mild volume overload Plan Increase Lasix to 40 mg daily Continue rest of the medications Possible discharge home tomorrow  LOS: 9 days    Rinaldo Cloud 09/25/2015, 10:32 AM

## 2015-09-25 NOTE — Discharge Instructions (Signed)
Blood Glucose Monitoring, Adult Monitoring your blood glucose (also know as blood sugar) helps you to manage your diabetes. It also helps you and your health care provider monitor your diabetes and determine how well your treatment plan is working. WHY SHOULD YOU MONITOR YOUR BLOOD GLUCOSE? It can help you understand how food, exercise, and medicine affect your blood glucose. It allows you to know what your blood glucose is at any given moment. You can quickly tell if you are having low blood glucose (hypoglycemia) or high blood glucose (hyperglycemia). It can help you and your health care provider know how to adjust your medicines. It can help you understand how to manage an illness or adjust medicine for exercise. WHEN SHOULD YOU TEST? Your health care provider will help you decide how often you should check your blood glucose. This may depend on the type of diabetes you have, your diabetes control, or the types of medicines you are taking. Be sure to write down all of your blood glucose readings so that this information can be reviewed with your health care provider. See below for examples of testing times that your health care provider may suggest. Type 1 Diabetes Test at least 2 times per day if your diabetes is well controlled, if you are using an insulin pump, or if you perform multiple daily injections. If your diabetes is not well controlled or if you are sick, you may need to test more often. It is a good idea to also test: Before every insulin injection. Before and after exercise. Between meals and 2 hours after a meal. Occasionally between 2:00 a.m. and 3:00 a.m. Type 2 Diabetes If you are taking insulin, test at least 2 times per day. However, it is best to test before every insulin injection. If you take medicines by mouth (orally), test 2 times a day. If you are on a controlled diet, test once a day. If your diabetes is not well controlled or if you are sick, you may need to  monitor more often. HOW TO MONITOR YOUR BLOOD GLUCOSE Supplies Needed Blood glucose meter. Test strips for your meter. Each meter has its own strips. You must use the strips that go with your own meter. A pricking needle (lancet). A device that holds the lancet (lancing device). A journal or log book to write down your results. Procedure Wash your hands with soap and water. Alcohol is not preferred. Prick the side of your finger (not the tip) with the lancet. Gently milk the finger until a small drop of blood appears. Follow the instructions that come with your meter for inserting the test strip, applying blood to the strip, and using your blood glucose meter. Other Areas to Get Blood for Testing Some meters allow you to use other areas of your body (other than your finger) to test your blood. These areas are called alternative sites. The most common alternative sites are: The forearm. The thigh. The back area of the lower leg. The palm of the hand. The blood flow in these areas is slower. Therefore, the blood glucose values you get may be delayed, and the numbers are different from what you would get from your fingers. Do not use alternative sites if you think you are having hypoglycemia. Your reading will not be accurate. Always use a finger if you are having hypoglycemia. Also, if you cannot feel your lows (hypoglycemia unawareness), always use your fingers for your blood glucose checks. ADDITIONAL TIPS FOR GLUCOSE MONITORING Do not reuse  lancets. Always carry your supplies with you. All blood glucose meters have a 24-hour "hotline" number to call if you have questions or need help. Adjust (calibrate) your blood glucose meter with a control solution after finishing a few boxes of strips. BLOOD GLUCOSE RECORD KEEPING It is a good idea to keep a daily record or log of your blood glucose readings. Most glucose meters, if not all, keep your glucose records stored in the meter. Some meters  come with the ability to download your records to your home computer. Keeping a record of your blood glucose readings is especially helpful if you are wanting to look for patterns. Make notes to go along with the blood glucose readings because you might forget what happened at that exact time. Keeping good records helps you and your health care provider to work together to achieve good diabetes management.    This information is not intended to replace advice given to you by your health care provider. Make sure you discuss any questions you have with your health care provider.   Document Released: 12/06/2003 Document Revised: 12/24/2014 Document Reviewed: 04/27/2013 Elsevier Interactive Patient Education 2016 [employer] CHOOSING A SITE FOR INJECTION Insulin absorption varies from site to site. As with any injectable medication it is best for the insulin to be injected within the same body region. However, do not inject the insulin in the same spot each time. Rotating the spots you give your injections will prevent inflammation or tissue breakdown. There are four main regions that can be used for injections. The regions include the:  Abdomen (preferred region, especially for non-insulin injectable diabetes medicine).  Front and upper outer sides of thighs.  Back of upper arm.  Buttocks. USING A SYRINGE AND VIAL Drawing up insulin: single insulin dose  Wash your hands with soap and water.  Gently roll the insulin bottle (vial) between your hands to mix it. Do not shake the vial.  Clean the top rubber part of the vial with an alcohol wipe. Be sure that the plastic pop-top has been removed on newer vials.  Remove the plastic cover from the needle on the syringe. Do not let the needle touch anything.  Pull the plunger back to draw air into the syringe. The air should be the same amount as the insulin dose.  Push the needle through the rubber on the top of the vial. Do not turn the vial  over.  Push the plunger in all the way to put the air into the vial.  Leave the needle in the vial and turn the vial and syringe upside down.  Pull down slowly on the plunger, drawing the amount of insulin you need into the syringe.  Look for air bubbles in the syringe. You may need to push the plunger up and down 2 to 3 times to slowly get rid of any air bubbles in the syringe.  Pull back the plunger to get your correct dose.  Remove the needle from the vial.  Use an alcohol wipe to clean the area of the body to be injected.  Pinch up 1 inch of skin and hold it.  Put the needle straight into the skin (90-degree angle). Put the needle in as far as it will go (to the hub). The needle may need to be injected at a 45-degree angle in small adults with little fat.  When the needle is in, you can let go of your skin.  Push the plunger down all the way  to inject the insulin.  Pull the needle straight out of the skin.  Press the alcohol wipe over the spot where you gave your injection. Keep it there for a few seconds. Do not rub the area.  Do not put the plastic cover back on the needle. Drawing up insulin: mixing 2 insulins  Wash your hands with soap and water.  Gently roll the vial of "cloudy" insulin between your hands or rotate the vial from top to bottom to mix.  Clean the top of both vials with an alcohol wipe. Be sure that the plastic pop-top lid has been removed on newer vials.  Pull air into the syringe to equal the dose of "cloudy" insulin.  Stick the needle into the "cloudy" insulin vial and inject the air. Be sure to keep the vial upright.  Remove the needle from the "cloudy" insulin vial.  Pull air into the syringe to equal the dose of "clear" insulin.  Stick the needle into the "clear" insulin vial and inject the air.  Leave the needle in the "clear" insulin vial and turn the vial upside down.  Pull down on the plunger and slowly draw into the syringe the number  of units of "clear" insulin desired.  Look for air bubbles in the syringe. You may need to push the plunger up and down 2 to 3 times to slowly get rid of any air bubbles in the syringe.  Remove the needle from the "clear" insulin vial.  Stick the needle into the "cloudy" insulin vial. Do not inject any of the "clear" insulin into the "cloudy" vial.  Turn the "cloudy" vial upside down and pull the plunger down to the number of units that equals the total number of units of "clear" and "cloudy" insulins.  Remove the needle from the "cloudy" insulin vial.  Use an alcohol wipe to clean the area of the body to be injected.  Put the needle straight into the skin (90-degree angle). Put the needle in as far as it will go (to the hub). The needle may need to be injected at a 45-degree angle in small adults with little fat.  When the needle is in, you can let go of your skin.  Push the plunger down all the way to inject the insulin.  Pull the needle straight out of the skin.  Press the alcohol wipe over the spot where you gave your injection. Keep it there for a few seconds. Do not rub the area.  Do not put the plastic cover back on the needle. USING INSULIN PENS  Wash your hands with soap and water.  If you are using the "cloudy" insulin, roll the pen between your palms several times or rotate the pen top to bottom several times.  Remove the insulin pen cap.  Clean the rubber stopper of the cartridge with an alcohol wipe.  Remove the protective paper tab from the disposable needle.  Screw the needle onto the pen.  Remove the outer plastic needle cover.  Remove the inner plastic needle cover.  Prime the insulin pen by turning the button (dial) to 2 units. Hold the pen with the needle pointing up, and push the dial on the opposite end until a drop of insulin appears at the needle tip. If no insulin appears, repeat this step.  Dial the number of units of insulin you will  inject.  Use an alcohol wipe to clean the area of the body to be injected.  Pinch up 1  inch of skin and hold it.  Put the needle straight into the skin (90-degree angle).  Push the dial down to push the insulin into the fat tissue.  Count to 10 slowly. Then, remove the needle from the fat tissue.  Carefully replace the larger outer plastic needle cover over the needle and unscrew the capped needle. THROWING AWAY SUPPLIES  Discard used needles in a puncture proof sharps disposal container. Follow disposal regulations for the area where you live.  Vials and empty disposable pens may be thrown away in the regular trash.   This information is not intended to replace advice given to you by your health care provider. Make sure you discuss any questions you have with your health care provider.   Document Released: 02/23/2004 Document Revised: 12/24/2014 Document Reviewed: 05/12/2013 Elsevier Interactive Patient Education 2016 [employer] Inc. Endoscopic Saphenous Vein Harvesting, Care After Refer to this sheet in the next few weeks. These instructions provide you with information on caring for yourself after your procedure. Your health care provider may also give you more specific instructions. Your treatment has been planned according to current medical practices, but problems sometimes occur. Call your health care provider if you have any problems or questions after your procedure. HOME CARE INSTRUCTIONS Medicine  Take whatever pain medicine your surgeon prescribes. Follow the directions carefully. Do not take over-the-counter pain medicine unless your surgeon says it is okay. Some pain medicine can cause bleeding problems for several weeks after surgery.  Follow your surgeon's instructions about driving. You will probably not be permitted to drive after heart surgery.  Take any medicines your surgeon prescribes. Any medicines you took before your heart surgery should be checked with your  health care provider before you start taking them again. Wound care  If your surgeon has prescribed an elastic bandage or stocking, ask how long you should wear it.  Check the area around your surgical cuts (incisions) whenever your bandages (dressings) are changed. Look for any redness or swelling.  You will need to return to have the stitches (sutures) or staples taken out. Ask your surgeon when to do that.  Ask your surgeon when you can shower or bathe. Activity  Try to keep your legs raised when you are sitting.  Do any exercises your health care providers have given you. These may include deep breathing exercises, coughing, walking, or other exercises. SEEK MEDICAL CARE IF:  You have any questions about your medicines.  You have more leg pain, especially if your pain medicine stops working.  New or growing bruises develop on your leg.  Your leg swells, feels tight, or becomes red.  You have numbness in your leg. SEEK IMMEDIATE MEDICAL CARE IF:  Your pain gets much worse.  Blood or fluid leaks from any of the incisions.  Your incisions become warm, swollen, or red.  You have chest pain.  You have trouble breathing.  You have a fever.  You have more pain near your leg incision. MAKE SURE YOU:  Understand these instructions.  Will watch your condition.  Will get help right away if you are not doing well or get worse.   This information is not intended to replace advice given to you by your health care provider. Make sure you discuss any questions you have with your health care provider.   Document Released: 08/15/2011 Document Revised: 12/24/2014 Document Reviewed: 08/15/2011 Elsevier Interactive Patient Education 2016 [employer] Inc. Coronary Artery Bypass Grafting, Care After These instructions give you information on  caring for yourself after your procedure. Your doctor may also give you more specific instructions. Call your doctor if you have any problems or  questions after your procedure.  HOME CARE  Only take medicine as told by your doctor. Take medicines exactly as told. Do not stop taking medicines or start any new medicines without talking to your doctor first.  Take your pulse as told by your doctor.  Do deep breathing as told by your doctor. Use your breathing device (incentive spirometer), if given, to practice deep breathing several times a day. Support your chest with a pillow or your arms when you take deep breaths or cough.  Keep the area clean, dry, and protected where the surgery cuts (incisions) were made. Remove bandages (dressings) only as told by your doctor. If strips were applied to surgical area, do not take them off. They fall off on their own.  Check the surgery area daily for puffiness (swelling), redness, or leaking fluid.  If surgery cuts were made in your legs:  Avoid crossing your legs.  Avoid sitting for long periods of time. Change positions every 30 minutes.  Raise your legs when you are sitting. Place them on pillows.  Wear stockings that help keep blood clots from forming in your legs (compression stockings).  Only take sponge baths until your doctor says it is okay to take showers. Pat the surgery area dry. Do not rub the surgery area with a washcloth or towel. Do not bathe, swim, or use a hot tub until your doctor says it is okay.  Eat foods that are high in fiber. These include raw fruits and vegetables, whole grains, beans, and nuts. Choose lean meats. Avoid canned, processed, and fried foods.  Drink enough fluids to keep your pee (urine) clear or pale yellow.  Weigh yourself every day.  Rest and limit activity as told by your doctor. You may be told to:  Stop any activity if you have chest pain, shortness of breath, changes in heartbeat, or dizziness. Get help right away if this happens.  Move around often for short amounts of time or take short walks as told by your doctor. Gradually become more  active. You may need help to strengthen your muscles and build endurance.  Avoid lifting, pushing, or pulling anything heavier than 10 pounds (4.5 kg) for at least 6 weeks after surgery.  Do not drive until your doctor says it is okay.  Ask your doctor when you can go back to work.  Ask your doctor when you can begin sexual activity again.  Follow up with your doctor as told. GET HELP IF:  You have puffiness, redness, more pain, or fluid draining from the incision site.  You have a fever.  You have puffiness in your ankles or legs.  You have pain in your legs.  You gain 2 or more pounds (0.9 kg) a day.  You feel sick to your stomach (nauseous) or throw up (vomit).  You have watery poop (diarrhea). GET HELP RIGHT AWAY IF:  You have chest pain that goes to your jaw or arms.  You have shortness of breath.  You have a fast or irregular heartbeat.  You notice a "clicking" in your breastbone when you move.  You have numbness or weakness in your arms or legs.  You feel dizzy or light-headed. MAKE SURE YOU:  Understand these instructions.  Will watch your condition.  Will get help right away if you are not doing well or get  worse.   This information is not intended to replace advice given to you by your health care provider. Make sure you discuss any questions you have with your health care provider.   Document Released: 12/08/2013 Document Reviewed: 12/08/2013 Elsevier Interactive Patient Education [employer]

## 2015-09-26 LAB — GLUCOSE, CAPILLARY
GLUCOSE-CAPILLARY: 109 mg/dL — AB (ref 65–99)
Glucose-Capillary: 249 mg/dL — ABNORMAL HIGH (ref 65–99)

## 2015-09-26 MED ORDER — OXYCODONE HCL 5 MG PO TABS: 5.0000 mg | ORAL_TABLET | ORAL | Status: DC | PRN

## 2015-09-26 MED ORDER — ATORVASTATIN CALCIUM 80 MG PO TABS: 80.0000 mg | ORAL_TABLET | Freq: Every day | ORAL | Status: AC

## 2015-09-26 MED ORDER — ISOSORBIDE MONONITRATE ER 30 MG PO TB24: 30.0000 mg | ORAL_TABLET | Freq: Every day | ORAL | Status: AC

## 2015-09-26 MED ORDER — LISINOPRIL 2.5 MG PO TABS: 2.5000 mg | ORAL_TABLET | Freq: Every day | ORAL | Status: AC

## 2015-09-26 MED ORDER — CARVEDILOL 12.5 MG PO TABS: 12.5000 mg | ORAL_TABLET | Freq: Two times a day (BID) | ORAL | Status: AC

## 2015-09-26 MED ORDER — ASPIRIN 325 MG PO TBEC: 325.0000 mg | DELAYED_RELEASE_TABLET | Freq: Every day | ORAL | Status: AC

## 2015-09-26 MED ORDER — INSULIN DETEMIR 100 UNIT/ML ~~LOC~~ SOLN: 20.0000 [IU] | Freq: Every day | SUBCUTANEOUS | Status: AC

## 2015-09-26 MED ORDER — FUROSEMIDE 40 MG PO TABS: 40.0000 mg | ORAL_TABLET | Freq: Every day | ORAL | Status: AC

## 2015-09-26 MED ORDER — POTASSIUM CHLORIDE CRYS ER 10 MEQ PO TBCR: 10.0000 meq | EXTENDED_RELEASE_TABLET | Freq: Every day | ORAL | Status: AC

## 2015-09-26 NOTE — Progress Notes (Signed)
Ref: Pola Corn, MD   Subjective:  Doing well. Ready to go home.   Objective:  Vital Signs in the last 24 hours: Temp:  [98.2 F (36.8 C)] 98.2 F (36.8 C) (10/10 0515) Pulse Rate:  [86-94] 94 (10/10 0754) Cardiac Rhythm:  [-] Normal sinus rhythm (10/10 0700) Resp:  [17-18] 18 (10/10 0515) BP: (96-130)/(44-58) 130/58 mmHg (10/10 0754) SpO2:  [95 %-98 %] 95 % (10/10 0515) Weight:  [70.2 kg (154 lb 12.2 oz)] 70.2 kg (154 lb 12.2 oz) (10/10 0515)  Physical Exam: BP Readings from Last 1 Encounters:  09/26/15 130/58    Wt Readings from Last 1 Encounters:  09/26/15 70.2 kg (154 lb 12.2 oz)    Weight change: -0.7 kg (-1 lb 8.7 oz)  HEENT: [place]/AT, Eyes-Brown, PERL, EOMI, Conjunctiva-Pink, Sclera-Non-icteric Neck: No JVD, No bruit, Trachea midline. Lungs:  Clear, Bilateral. Midline scar. Cardiac:  Regular rhythm, normal S1 and S2, no S3.  Abdomen:  Soft, non-tender. Extremities:  No edema present. No cyanosis. No clubbing. Healing right leg wound. CNS: AxOx3, Cranial nerves grossly intact, moves all 4 extremities. Right handed. Skin: Warm and dry.   Intake/Output from previous day: 10/09 0701 - 10/10 0700 In: 1080 [P.O.:1080] Out: 2775 [Urine:2775]    Lab Results: BMET    Component Value Date/Time   NA 137 09/24/2015 0324   NA 136 09/23/2015 0300   NA 138 09/22/2015 0400   K 4.5 09/24/2015 0324   K 4.2 09/23/2015 0300   K 3.9 09/22/2015 0400   CL 97* 09/24/2015 0324   CL 95* 09/23/2015 0300   CL 99* 09/22/2015 0400   CO2 29 09/24/2015 0324   CO2 30 09/23/2015 0300   CO2 30 09/22/2015 0400   GLUCOSE 190* 09/24/2015 0324   GLUCOSE 160* 09/23/2015 0300   GLUCOSE 71 09/22/2015 0400   BUN 15 09/24/2015 0324   BUN 16 09/23/2015 0300   BUN 19 09/22/2015 0400   CREATININE 0.79 09/24/2015 0324   CREATININE 0.68 09/23/2015 0300   CREATININE 0.67 09/22/2015 0400   CALCIUM 8.8* 09/24/2015 0324   CALCIUM 8.4* 09/23/2015 0300   CALCIUM 8.3* 09/22/2015 0400   GFRNONAA >60 09/24/2015 0324   GFRNONAA >60 09/23/2015 0300   GFRNONAA >60 09/22/2015 0400   GFRAA >60 09/24/2015 0324   GFRAA >60 09/23/2015 0300   GFRAA >60 09/22/2015 0400   CBC    Component Value Date/Time   WBC 7.5 09/24/2015 0324   RBC 3.50* 09/24/2015 0324   HGB 10.1* 09/24/2015 0324   HCT 30.0* 09/24/2015 0324   PLT 247 09/24/2015 0324   MCV 85.7 09/24/2015 0324   MCH 28.9 09/24/2015 0324   MCHC 33.7 09/24/2015 0324   RDW 13.9 09/24/2015 0324   HEPATIC Function Panel  Recent Labs  09/17/15 0151 09/18/15 0306  PROT 6.0* 6.3*   HEMOGLOBIN A1C No components found for: HGA1C,  MPG CARDIAC ENZYMES Lab Results  Component Value Date   TROPONINI 1.08* 09/20/2015   TROPONINI 1.48* 09/20/2015   TROPONINI 1.65* 09/20/2015   BNP No results for input(s): PROBNP in the last 8760 hours. TSH No results for input(s): TSH in the last 8760 hours. CHOLESTEROL No results for input(s): CHOL in the last 8760 hours.  Scheduled Meds: . antiseptic oral rinse  7 mL Mouth Rinse BID  . aspirin EC  325 mg Oral Daily  . atorvastatin  80 mg Oral q1800  . carvedilol  12.5 mg Oral BID WC  . docusate sodium  200 mg Oral  Daily  . enoxaparin (LOVENOX) injection  30 mg Subcutaneous Q24H  . furosemide  40 mg Oral Daily  . glimepiride  4 mg Oral BID WC  . insulin aspart  0-24 Units Subcutaneous TID AC & HS  . insulin detemir  15 Units Subcutaneous QHS  . isosorbide mononitrate  30 mg Oral Daily  . lisinopril  2.5 mg Oral Daily  . metFORMIN  1,000 mg Oral BID WC  . pantoprazole  40 mg Oral QAC breakfast  . potassium chloride  10 mEq Oral Daily  . sodium chloride  3 mL Intravenous Q12H   Continuous Infusions:  PRN Meds:.sodium chloride, acetaminophen, bisacodyl **OR** bisacodyl, guaiFENesin, ondansetron **OR** ondansetron (ZOFRAN) IV, oxyCODONE, sodium chloride, traMADol  Assessment/Plan: Severe 3 vessel CAD with LV systolic dysfunction CABG-5 vessel DM, II Dyslipidemia Tobacco  use disorder Anxiety  F/U in 1 month.    LOS: 10 days    Orpah Cobb  MD  09/26/2015, 10:15 AM

## 2015-09-26 NOTE — Progress Notes (Signed)
09/26/2015 1:14 PM Discharge AVS meds taken today and those due this evening reviewed.  Follow-up appointments and when to call md reviewed.  D/C IV and TELE.  Questions and concerns addressed.   D/C home per orders. Kathryne Hitch

## 2015-09-26 NOTE — Progress Notes (Signed)
09/26/2015 8:55 AM Chest tube sutures removed.  Pt tolerated well. Kathryne Hitch

## 2015-09-26 NOTE — Care Management Note (Signed)
Case Management Note RN note started by Avie Arenas RNCM  Patient Details  Name: Dustin Rodriguez MRN: [id number] Date of Birth: 01-Aug-1956  Subjective/Objective:       Talked with wife and patient yesterday.  Patient adamant that he has Medicaid.  Wife in agreement.  Talked with Artist, [name].  Confirmed that he did have Medicaid but it has expired.  She is not sure why.  Talked with wife again today without husband and she states that he has not been able to use Medicaid, states they needed something, but unsure what it was.  Encouraged his SW at [facility] to see what they need to reinstated.  States he has  Been using his orange card to get his medications and has not had a problem getting his meds.              Action/Plan:   Expected Discharge Date:     09/26/15             Expected Discharge Plan:  Home/Self Care (Pt has PCP and has orange card, denied hardship with paying for medications)  In-House Referral:  Clinical Social Work  Discharge planning Services  CM Consult, MATCH Program, Medication Assistance  Post Acute Care Choice:  Durable Medical Equipment Choice offered to:     DME Arranged:  Walker rolling DME Agency:  [facility]  HH Arranged:    HH Agency:     Status of Service:  Completed, signed off  Medicare Important Message Given:    Date Medicare IM Given:    Medicare IM give by:    Date Additional Medicare IM Given:    Additional Medicare Important Message give by:     If discussed at Long Length of Stay Meetings, dates discussed:    Additional Comments:  09/26/15- [name] RN BSN [phone] Pt for discharge today- spoke with pt and wife at bedside regarding d/c medication needs- pt has been started on levemir- and does not have insurance- per pt he has applied for medicaid -but it is still pending (FC have spoken with pt and wife)- pt will need MATCH assistance at discharge- once Medicaid approved- Levemir is on the preferred drug  list.  Checked other meds on AVS- and most are on $4 list at [facility] other most costly is $12 and pain meds would run about $22. Went over cost of meds with pt and wife- explained that medication assistance with MATCH would cover at cost of $3 per prescription but would not cover pain meds and would be one time use at time of discharge from hospital- [facility] letter along with prescriptions given to wife- wife expressed understanding of this and appreciation for assistance. Also explained that they may need to ask about insulin samples from PCP if medicaid did not come through in the next couple of weeks. Pt reports that he has [employer] and this helps him with medications. Pt also trying to contact his PCP. RW has also been ordered - have spoken with [name] with [facility]- to be delivered to room prior to discharge.   [name], RN 09/26/2015, 10:52 AM

## 2015-09-26 NOTE — Progress Notes (Signed)
Utilization review completed.  

## 2015-09-26 NOTE — Progress Notes (Signed)
CARDIAC REHAB PHASE I   PRE:  Rate/Rhythm: 91 SR  BP:  Supine: 130/58  Sitting:   Standing:    SaO2: 92%RA  MODE:  Ambulation: 550 ft   POST:  Rate/Rhythm: 96 SR  BP:  Supine:   Sitting: 141/73  Standing:    SaO2: 97%RA [id number] Pt walked 550 ft on RA with hand held asst. Gait steady. Wants rolling walker for home use. Demonstrated IS use. Can get to about .  Education completed on Saturday by Cardiac Rehab.   Luetta Nutting, RN BSN  09/26/2015 9:15 AM

## 2015-09-26 NOTE — Progress Notes (Signed)
      [address]       [place] [address]             [phone]      7 Days Post-Op Procedure(s) (LRB): CORONARY ARTERY BYPASS GRAFTING (CABG) (N/A) TRANSESOPHAGEAL ECHOCARDIOGRAM (TEE) (N/A) Subjective: Feels pretty well overall  Objective: Vital signs in last 24 hours: Temp:  [98.2 F (36.8 C)] 98.2 F (36.8 C) (10/10 0515) Pulse Rate:  [86-88] 86 (10/10 0515) Cardiac Rhythm:  [-] Normal sinus rhythm (10/09 1900) Resp:  [17-18] 18 (10/10 0515) BP: (96-122)/(44-57) 122/52 mmHg (10/10 0515) SpO2:  [95 %-98 %] 95 % (10/10 0515) Weight:  [154 lb 12.2 oz (70.2 kg)] 154 lb 12.2 oz (70.2 kg) (10/10 0515)  Hemodynamic parameters for last 24 hours:    Intake/Output from previous day: 10/09 0701 - 10/10 0700 In: 1080 [P.O.:1080] Out: 2775 [Urine:2775] Intake/Output this shift:    General appearance: alert, cooperative and no distress Heart: regular rate and rhythm Lungs: clear to auscultation bilaterally Abdomen: benign Extremities: + right>left LE edema Wound: incis healing well  Lab Results:  Recent Labs  09/24/15 0324  WBC 7.5  HGB 10.1*  HCT 30.0*  PLT 247   BMET:  Recent Labs  09/24/15 0324  NA 137  K 4.5  CL 97*  CO2 29  GLUCOSE 190*  BUN 15  CREATININE 0.79  CALCIUM 8.8*    PT/INR: No results for input(s): LABPROT, INR in the last 72 hours. ABG    Component Value Date/Time   PHART 7.368 09/19/2015 2204   HCO3 25.1* 09/19/2015 2204   TCO2 23 09/20/2015 1602   ACIDBASEDEF 2.0 09/19/2015 1551   O2SAT 93.0 09/19/2015 2204   CBG (last 3)   Recent Labs  09/25/15 1135 09/25/15 1608 09/25/15 2130  GLUCAP 265* 95 163*    Meds Scheduled Meds: . antiseptic oral rinse  7 mL Mouth Rinse BID  . aspirin EC  325 mg Oral Daily  . atorvastatin  80 mg Oral q1800  . carvedilol  12.5 mg Oral BID WC  . docusate sodium  200 mg Oral Daily  . enoxaparin (LOVENOX) injection  30 mg Subcutaneous Q24H  . furosemide  40 mg Oral Daily   . glimepiride  4 mg Oral BID WC  . insulin aspart  0-24 Units Subcutaneous TID AC & HS  . insulin detemir  15 Units Subcutaneous QHS  . isosorbide mononitrate  30 mg Oral Daily  . lisinopril  2.5 mg Oral Daily  . metFORMIN  1,000 mg Oral BID WC  . pantoprazole  40 mg Oral QAC breakfast  . potassium chloride  10 mEq Oral Daily  . sodium chloride  3 mL Intravenous Q12H   Continuous Infusions:  PRN Meds:.sodium chloride, acetaminophen, bisacodyl **OR** bisacodyl, guaiFENesin, ondansetron **OR** ondansetron (ZOFRAN) IV, oxyCODONE, sodium chloride, traMADol  Xrays No results found.  Assessment/Plan: S/P Procedure(s) (LRB): CORONARY ARTERY BYPASS GRAFTING (CABG) (N/A) TRANSESOPHAGEAL ECHOCARDIOGRAM (TEE) (N/A)  1 conts to do well 2 sugars still variable- will increase insulin a little 3 Cont diuretic at home for a while with LE edema 4 stable for D/C    LOS: 10 days    Dustin Rodriguez E 09/26/2015

## 2015-10-11 ENCOUNTER — Encounter: Payer: No Typology Code available for payment source | Admitting: Cardiothoracic Surgery

## 2015-10-19 ENCOUNTER — Other Ambulatory Visit: Payer: Self-pay | Admitting: Cardiothoracic Surgery

## 2015-10-19 DIAGNOSIS — Z951 Presence of aortocoronary bypass graft: Secondary | ICD-10-CM

## 2015-10-20 ENCOUNTER — Encounter: Payer: Self-pay | Admitting: Cardiothoracic Surgery

## 2015-10-20 ENCOUNTER — Ambulatory Visit
Admission: RE | Admit: 2015-10-20 | Discharge: 2015-10-20 | Disposition: A | Discharge status: Home / Self Care | Payer: No Typology Code available for payment source | Source: Ambulatory Visit | Attending: Cardiothoracic Surgery | Admitting: Cardiothoracic Surgery

## 2015-10-20 ENCOUNTER — Ambulatory Visit (INDEPENDENT_AMBULATORY_CARE_PROVIDER_SITE_OTHER): Payer: Self-pay | Admitting: Cardiothoracic Surgery

## 2015-10-20 ENCOUNTER — Encounter: Payer: No Typology Code available for payment source | Admitting: Cardiothoracic Surgery

## 2015-10-20 VITALS — BP 130/75 | HR 80 | Resp 20 | Ht 62.0 in | Wt 154.0 lb

## 2015-10-20 DIAGNOSIS — G8918 Other acute postprocedural pain: Secondary | ICD-10-CM

## 2015-10-20 DIAGNOSIS — Z951 Presence of aortocoronary bypass graft: Secondary | ICD-10-CM

## 2015-10-20 MED ORDER — OXYCODONE HCL 5 MG PO TABS: 5.0000 mg | ORAL_TABLET | Freq: Four times a day (QID) | ORAL | Status: DC | PRN

## 2015-10-20 NOTE — Progress Notes (Signed)
[address]       [place] [address]             [phone]      [facility] Medical Record #[id number] Date of Birth: [birth year]  Referring: Orpah Cobb, MD Primary Care: Pola Corn, MD  Chief Complaint:   POST OP FOLLOW UP 09/19/2015  OPERATIVE REPORT PREOPERATIVE DIAGNOSIS: Three-vessel coronary artery disease with severe LV dysfunction, and poorly controlled diabetes. POSTOPERATIVE DIAGNOSIS: Three-vessel coronary artery disease with severe LV dysfunction, and poorly controlled diabetes. SURGICAL PROCEDURE: Coronary artery bypass grafting x5, with the left internal mammary to the left anterior descending coronary artery, sequential reverse saphenous vein graft to the diagonal coronary artery and intermediate coronary artery, reverse saphenous vein graft to the circumflex coronary artery, reverse saphenous vein graft to the distal right coronary artery with right leg greater saphenous thigh and calf endoscopic harvesting.Placement of Right femoral A line. SURGEON: Sheliah Plane, MD  History of Present Illness:     Doing well post op, still problems controlling glucose      Past Medical History  Diagnosis Date  . Coronary artery disease   . Hypercholesterolemia     "diet controlled" (09/16/2015)  . Type II diabetes mellitus (HCC)   . Chronic lower back pain   . Dilated cardiomyopathy (HCC)     Hattie Perch 09/16/2015     History  Smoking status  . Current Every Day Smoker -- 1.25 packs/day for 21 years  . Types: Cigarettes  Smokeless tobacco  . Never Used    History  Alcohol Use No     No Known Allergies  Current Outpatient Prescriptions  Medication Sig Dispense Refill  . aspirin EC 325 MG EC tablet Take 1 tablet (325 mg total) by mouth daily.    Marland Kitchen atorvastatin (LIPITOR) 80 MG tablet Take 1 tablet (80 mg total) by mouth daily at 6 PM. 30 tablet 1  . carvedilol (COREG) 12.5 MG tablet Take 1 tablet (12.5 mg  total) by mouth 2 (two) times daily with a meal. 60 tablet 1  . furosemide (LASIX) 40 MG tablet Take 1 tablet (40 mg total) by mouth daily. 14 tablet 0  . glimepiride (AMARYL) 4 MG tablet Take 4 mg by mouth 2 (two) times daily.    . insulin detemir (LEVEMIR) 100 UNIT/ML injection Inject 0.2 mLs (20 Units total) into the skin at bedtime. (Patient taking differently: Inject 25 Units into the skin at bedtime. ) 10 mL 11  . isosorbide mononitrate (IMDUR) 30 MG 24 hr tablet Take 1 tablet (30 mg total) by mouth daily. 30 tablet 1  . lisinopril (PRINIVIL,ZESTRIL) 2.5 MG tablet Take 1 tablet (2.5 mg total) by mouth daily. 30 tablet 1  . metFORMIN (GLUCOPHAGE) 1000 MG tablet Take 1,000 mg by mouth 2 (two) times daily with a meal.    . oxyCODONE (OXY IR/ROXICODONE) 5 MG immediate release tablet Take 1 tablet (5 mg total) by mouth every 6 (six) hours as needed for moderate pain or severe pain. 50 tablet 0  . potassium chloride (K-DUR,KLOR-CON) 10 MEQ tablet Take 1 tablet (10 mEq total) by mouth daily. 14 tablet 0   No current facility-administered medications for this visit.       Physical Exam: BP 130/75 mmHg  Pulse 80  Resp 20  Ht  (1.575 m)  Wt 154 lb (69.854 kg)  BMI 28.16 kg/m2  SpO2 98%  General appearance: alert and cooperative Neurologic: intact  Heart: regular rate and rhythm, S1, S2 normal, no murmur, click, rub or gallop Lungs: clear to auscultation bilaterally Abdomen: soft, non-tender; bowel sounds normal; no masses,  no organomegaly Extremities: edema 1-2+ pedal edema both ankles Wound: Sternum is well-healed right and the vein harvest sites also well-healed,    Diagnostic Studies & Laboratory data:     Recent Radiology Findings:   Dg Chest 2 View  10/20/2015  CLINICAL DATA:  CABG. EXAM: CHEST  2 VIEW COMPARISON:  09/22/2015.  09/21/2015. FINDINGS: Mediastinum and hilar structures are normal. Prior CABG. Cardiomegaly with normal pulmonary vascularity. No focal infiltrate.  No pleural effusion or pneumothorax. IMPRESSION: 1. No acute cardiopulmonary disease. 2. Prior CABG.  Cardiomegaly with normal pulmonary vascularity. Electronically Signed   By: Maisie Fushomas  Register   On: 10/20/2015 09:17      Recent Lab Findings: Lab Results  Component Value Date   WBC 7.5 09/24/2015   HGB 10.1* 09/24/2015   HCT 30.0* 09/24/2015   PLT 247 09/24/2015   GLUCOSE 190* 09/24/2015   ALT 11* 09/18/2015   AST 12* 09/18/2015   NA 137 09/24/2015   K 4.5 09/24/2015   CL 97* 09/24/2015   CREATININE 0.79 09/24/2015   BUN 15 09/24/2015   CO2 29 09/24/2015   INR 1.38 09/19/2015   HGBA1C 9.8* 09/19/2015    preop echo: 08/2015  Left ventricle: The cavity size was normal. Wall thickness was normal. Systolic function was severely reduced. The estimated ejection fraction was in the range of 20% to 25%. Diffuse hypokinesis. There is akinesis of the anteroseptal and apical myocardium. There is akinesis of the mid-apicalinferolateral myocardium. Doppler parameters are consistent with abnormal left ventricular relaxation (grade 1 diastolic dysfunction  Assessment / Plan:     Patient progressing following coronary artery bypass grafting, he has known severe underlying LV dysfunction. 20-25% ejection fraction preoperatively. Patient notes that an outpatient echocardiogram was done last week but we do not have the results of this. He completed 2 weeks of Lasix but continues to have some lower extremity edema, he will contact cardiology office for renewal of his Lasix prescription.  I've encouraged him to enroll in cardiac rehabilitation, [place] and could potentially go to the cardiac rehabilitation program there. We'll plan to see him back as needed  With his severe LV dysfunction he remains on beta blocker and ACE inhibitor aspirin and is having his insulin levels titrated to gain better control, his preop hemoglobin A1c was 9.8   Delight OvensEdward B Gerhardt MD        [address] [place] [id number] Office [phone]   Beeper [phone]  10/20/2015 10:33 AM

## 2015-10-31 DIAGNOSIS — Z736 Limitation of activities due to disability: Secondary | ICD-10-CM

## 2015-11-17 ENCOUNTER — Encounter: Payer: Self-pay | Admitting: *Deleted

## 2015-11-17 NOTE — Progress Notes (Signed)
Patient ID: Dustin Rodriguez, male   DOB: 01/25/1956, 59 y.o.   MRN: [id number] Mr. Dustin Rodriguez has [facility] cardiac rehab phase II per Dr. Dennie MaizesGerhardt's request.

## 2015-11-23 ENCOUNTER — Ambulatory Visit (INDEPENDENT_AMBULATORY_CARE_PROVIDER_SITE_OTHER): Payer: Self-pay | Admitting: Physician Assistant

## 2015-11-23 ENCOUNTER — Encounter: Payer: Self-pay | Admitting: Physician Assistant

## 2015-11-23 VITALS — BP 118/71 | HR 81 | Resp 16 | Ht 62.0 in | Wt 157.5 lb

## 2015-11-23 DIAGNOSIS — Z951 Presence of aortocoronary bypass graft: Secondary | ICD-10-CM

## 2015-11-23 NOTE — Progress Notes (Signed)
  HPI:  Patient returns for routine postoperative follow-up having undergone a CXBG x 5 on 09/19/2015. Since hospital discharge the patient reports has occasional blurry vision, numbness upper left chest lateral to sternal incision, right knee hurst, and right lower leg sometimes hurst and becomes swollen at the end of the day. He denies chest pain, shortness of breath, and fever. He has already   Current Outpatient Prescriptions  Medication Sig Dispense Refill  . aspirin EC 325 MG EC tablet Take 1 tablet (325 mg total) by mouth daily.    Marland Kitchen. atorvastatin (LIPITOR) 80 MG tablet Take 1 tablet (80 mg total) by mouth daily at 6 PM. 30 tablet 1  . carvedilol (COREG) 12.5 MG tablet Take 1 tablet (12.5 mg total) by mouth 2 (two) times daily with a meal. 60 tablet 1  . furosemide (LASIX) 40 MG tablet Take 1 tablet (40 mg total) by mouth daily. 14 tablet 0  . glimepiride (AMARYL) 4 MG tablet Take 4 mg by mouth 2 (two) times daily.    . insulin detemir (LEVEMIR) 100 UNIT/ML injection Inject 0.2 mLs (20 Units total) into the skin at bedtime. (Patient taking differently: Inject 25 Units into the skin at bedtime. ) 10 mL 11  . isosorbide mononitrate (IMDUR) 30 MG 24 hr tablet Take 1 tablet (30 mg total) by mouth daily. 30 tablet 1  . lisinopril (PRINIVIL,ZESTRIL) 2.5 MG tablet Take 1 tablet (2.5 mg total) by mouth daily. 30 tablet 1  . metFORMIN (GLUCOPHAGE) 1000 MG tablet Take 1,000 mg by mouth 2 (two) times daily with a meal.    . oxyCODONE (OXY IR/ROXICODONE) 5 MG immediate release tablet Take 1 tablet (5 mg total) by mouth every 6 (six) hours as needed for moderate pain or severe pain. 50 tablet 0  . potassium chloride (K-DUR,KLOR-CON) 10 MEQ tablet Take 1 tablet (10 mEq total) by mouth daily. 14 tablet 0  Vital Signs: BP 118/71, RR 16, HR 81, and oxygen saturation 98% on room air   Physical Exam : CV-RRR Pulmonary-Clear to auscultation bilaterally Extremities-Trace RLE edema. Wound is clean, dry and  no sign of infection. Sternal wound-Clean, dry, well healed   Impression and Plan: Overall, Mr. Marnette Burgessziz is recovering well from CABG X 5. Regarding the left chest numbness, he was told this was related to his surgery (from taking down the left internal mammary artery) and should resolve with more time. Regarding his right knee pain, he needs to see his primary care physician (Dr. Shana ChuteSpruill) and possibly and orthopedist. Regarding his occasional blurry vision, he states he is trying to have his diabetes under control (pre op HGA1C 9.8). He needs to follow up with his medical doctor regarding further surveillance of HGA1C and diabetes management. Dr. Tyrone SageGerhardt also recommended he obtain an eye doctor. I told him his right leg will be sore for several more weeks. He has minimal swelling of right lower leg.He should keep his legs elevated as much as possible after work. He is scheduled to begin cardiac rehab at the end of the month. He will continue to be followed by Dr. Algie CofferKadakia and will see Dr. Tyrone SageGerhardt PRN.    Ardelle BallsZIMMERMAN,DONIELLE M, PA-C [facility] [phone]

## 2016-01-27 DIAGNOSIS — Z736 Limitation of activities due to disability: Secondary | ICD-10-CM

## 2016-09-02 ENCOUNTER — Emergency Department (HOSPITAL_COMMUNITY): Payer: Medicaid Other

## 2016-09-02 ENCOUNTER — Emergency Department (HOSPITAL_COMMUNITY)
Admission: EM | Admit: 2016-09-02 | Discharge: 2016-09-02 | Disposition: A | Discharge status: Home / Self Care | Payer: Medicaid Other | Attending: Emergency Medicine | Admitting: Emergency Medicine

## 2016-09-02 ENCOUNTER — Encounter (HOSPITAL_COMMUNITY): Payer: Self-pay

## 2016-09-02 DIAGNOSIS — F17210 Nicotine dependence, cigarettes, uncomplicated: Secondary | ICD-10-CM | POA: Insufficient documentation

## 2016-09-02 DIAGNOSIS — Z7982 Long term (current) use of aspirin: Secondary | ICD-10-CM | POA: Insufficient documentation

## 2016-09-02 DIAGNOSIS — E1165 Type 2 diabetes mellitus with hyperglycemia: Secondary | ICD-10-CM | POA: Diagnosis not present

## 2016-09-02 DIAGNOSIS — R42 Dizziness and giddiness: Secondary | ICD-10-CM | POA: Diagnosis not present

## 2016-09-02 DIAGNOSIS — Z7984 Long term (current) use of oral hypoglycemic drugs: Secondary | ICD-10-CM | POA: Insufficient documentation

## 2016-09-02 DIAGNOSIS — I2510 Atherosclerotic heart disease of native coronary artery without angina pectoris: Secondary | ICD-10-CM | POA: Insufficient documentation

## 2016-09-02 DIAGNOSIS — Z79899 Other long term (current) drug therapy: Secondary | ICD-10-CM | POA: Diagnosis not present

## 2016-09-02 DIAGNOSIS — Z951 Presence of aortocoronary bypass graft: Secondary | ICD-10-CM | POA: Insufficient documentation

## 2016-09-02 DIAGNOSIS — Z794 Long term (current) use of insulin: Secondary | ICD-10-CM | POA: Diagnosis not present

## 2016-09-02 LAB — I-STAT TROPONIN, ED: TROPONIN I, POC: 0 ng/mL (ref 0.00–0.08)

## 2016-09-02 LAB — BASIC METABOLIC PANEL
ANION GAP: 9 (ref 5–15)
BUN: 14 mg/dL (ref 6–20)
CHLORIDE: 104 mmol/L (ref 101–111)
CO2: 22 mmol/L (ref 22–32)
CREATININE: 0.56 mg/dL — AB (ref 0.61–1.24)
Calcium: 9.6 mg/dL (ref 8.9–10.3)
GFR calc non Af Amer: >60 mL/min (ref >60)
GLUCOSE: 182 mg/dL — AB (ref 65–99)
Potassium: 4.4 mmol/L (ref 3.5–5.1)
Sodium: 135 mmol/L (ref 135–145)

## 2016-09-02 LAB — CBC
HEMATOCRIT: 44.5 % (ref 39.0–52.0)
HEMOGLOBIN: 14 g/dL (ref 13.0–17.0)
MCH: 26.1 pg (ref 26.0–34.0)
MCHC: 31.5 g/dL (ref 30.0–36.0)
MCV: 82.9 fL (ref 78.0–100.0)
PLATELETS: 234 10*3/uL (ref 150–400)
RBC: 5.37 MIL/uL (ref 4.22–5.81)
RDW: 15.1 % (ref 11.5–15.5)
WBC: 6 10*3/uL (ref 4.0–10.5)

## 2016-09-02 MED ORDER — MECLIZINE HCL 25 MG PO TABS: 25.0000 mg | ORAL_TABLET | Freq: Three times a day (TID) | ORAL | 0 refills | Status: AC | PRN

## 2016-09-02 NOTE — ED Notes (Signed)
Pt to MRI

## 2016-09-02 NOTE — ED Notes (Signed)
Called pt to re-check vitals and unable to locate pt in waiting room.

## 2016-09-02 NOTE — ED Provider Notes (Signed)
[facility]EM[facility]Y DEPT Provider Note   CSN: [id number] Arrival date & time: 09/02/16  1108     History   Chief Complaint Chief Complaint  Patient presents with  . Dizziness    HPI Dustin Rodriguez is a 60 y.o. male.She awakened 8 AM today with feeling of off balance as if the room is moving exacerbated by looking up. Improved by remaining still. No other associated symptoms He also had blurred vision . Symptoms lasted several hours and resolved spontaneously without treatment. He is presently asymptomatic. No difficulty with speech no focal numbness or weakness No other associated symptoms  HPI  Past Medical History:  Diagnosis Date  . Chronic lower back pain   . Coronary artery disease   . Dilated cardiomyopathy (HCC)    Hattie Perch 09/16/2015  . Hypercholesterolemia    "diet controlled" (09/16/2015)  . Type II diabetes mellitus [facility])     Patient Active Problem List   Diagnosis Date Noted  . 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 Chest pain at rest 09/16/2015  . Acute coronary syndrome (HCC) 09/16/2015    Past Surgical History:  Procedure Laterality Date  . CARDIAC CATHETERIZATION N/A 09/16/2015   Procedure: Right/Left Heart Cath and Coronary Angiography;  Surgeon: Orpah Cobb, MD;  Location: [facility] INV[facility] CV LAB;  Service: Cardiovascular;  Laterality: N/A;  . CORONARY ARTERY BYPASS GRAFT N/A 09/19/2015   Procedure: CORONARY ARTERY BYPASS GRAFTING (CABG);  Surgeon: [name], MD;  Location: [facility] OR;  Service: Open Heart Surgery;  Laterality: N/A;  . TEE WITHOUT CARDIOVERSION N/A 09/19/2015   Procedure: TRANSESOPHAGEAL ECHOCARDIOGRAM (TEE);  Surgeon: [name], MD;  Location[facility] OR;  Service: Open Heart Surgery;  Laterality: N/A;       Home Medications    Prior to Admission medications   Medication Sig Start Date End Date Taking? Authorizing Provider  aspirin EC 325 MG EC tablet Take 1 tablet (325 mg total) by mouth daily. 09/26/15   Wayne E Gold, PA-C  atorvastatin (LIPITOR)  80 MG tablet Take 1 tablet (80 mg total) by mouth daily at 6 PM. 09/26/15   Wayne E Gold, PA-C  carvedilol (COREG) 12.5 MG tablet Take 1 tablet (12.5 mg total) by mouth 2 (two) times daily with a meal. 09/26/15   Wayne E Gold, PA-C  furosemide (LASIX) 40 MG tablet Take 1 tablet (40 mg total) by mouth daily. 09/26/15   Wayne E Gold, PA-C  glimepiride (AMARYL) 4 MG tablet Take 4 mg by mouth 2 (two) times daily.    Historical Provider, MD  insulin detemir (LEVEMIR) 100 UNIT/ML injection Inject 0.2 mLs (20 Units total) into the skin at bedtime. Patient taking differently: Inject 25 Units into the skin at bedtime.  09/26/15   Wayne E Gold, PA-C  isosorbide mononitrate (IMDUR) 30 MG 24 hr tablet Take 1 tablet (30 mg total) by mouth daily. 09/26/15   Wayne E Gold, PA-C  lisinopril (PRINIVIL,ZESTRIL) 2.5 MG tablet Take 1 tablet (2.5 mg total) by mouth daily. 09/26/15   Wayne E Gold, PA-C  metFORMIN (GLUCOPHAGE) 1000 MG tablet Take 1,000 mg by mouth 2 (two) times daily with a meal.    Historical Provider, MD  oxyCODONE (OXY IR/ROXICODONE) 5 MG immediate release tablet Take 1 tablet (5 mg total) by mouth every 6 (six) hours as needed for moderate pain or severe pain. 10/20/15   [name], MD  potassium chloride (K-DUR,KLOR-CON) 10 MEQ tablet Take 1 tablet (10 mEq total) by mouth daily. 09/26/15  Rowe Clack, PA-C    Family History No family history on file.  Social History Social History  Substance Use Topics  . Smoking status: Current Every Day Smoker    Packs/day: 1.25    Years: 21.00    Types: Cigarettes  . Smokeless tobacco: Never Used  . Alcohol use No     Allergies   Review of patient's allergies indicates no known allergies.   Review of Systems Review of Systems  Constitutional: Negative.   HENT: Negative.   Eyes: Positive for visual disturbance.  Respiratory: Negative.   Cardiovascular: Negative.        Syncope  Gastrointestinal: Negative.   Musculoskeletal: Negative.    Skin: Negative.   Allergic/Immunologic: Positive for immunocompromised state.       Diabetic  Neurological: Positive for dizziness.  Psychiatric/Behavioral: Negative.   All other systems reviewed and are negative.    Physical Exam Updated Vital Signs BP 150/58   Pulse 60   Temp 98 F (36.7 C)   Resp 11   SpO2 100%   Physical Exam  Constitutional: He is oriented to person, place, and time. He appears well-developed and well-nourished.  HENT:  Head: Normocephalic and atraumatic.  Eyes: Conjunctivae are normal. Pupils are equal, round, and reactive to light.  Neck: Neck supple. No tracheal deviation present. No thyromegaly present.  Cardiovascular: Normal rate and regular rhythm.   No murmur heard. Pulmonary/Chest: Effort normal and breath sounds normal.  Abdominal: Soft. Bowel sounds are normal. He exhibits no distension. There is no tenderness.  Musculoskeletal: Normal range of motion. He exhibits no edema or tenderness.  Neurological: He is alert and oriented to person, place, and time. He has normal reflexes. He displays normal reflexes. No cranial nerve deficit. He exhibits normal muscle tone.  Gait normal Romberg normal pronator drift normal finger to nose normal DTR symmetric bilaterally at knee jerk ankle jerk and biceps toes down going bilaterally  Skin: Skin is warm and dry. No rash noted.  Psychiatric: He has a normal mood and affect.  Nursing note and vitals reviewed.    ED Treatments / Results  Labs (all labs ordered are listed, but only abnormal results are displayed) Labs Reviewed  BASIC METABOLIC PANEL - Abnormal; Notable for the following:       Result Value   Glucose, Bld 182 (*)    Creatinine, Ser 0.56 (*)    All other components within normal limits  CBC  I-STAT TROPOININ, ED    EKG  EKG Interpretation  Date/Time:  Sunday September 02 2016 11:15:24 EDT Ventricular Rate:  64 PR Interval:  164 QRS Duration: 88 QT Interval:  426 QTC  Calculation: 439 R Axis:   70 Text Interpretation:  Normal sinus rhythm Left ventricular hypertrophy with repolarization abnormality Abnormal ECG Deeper T waves than Oct  2016 Confirmed by Criss Alvine MD, SCOTT [phone]) on 09/02/2016 11:26:29 AM       Radiology No results found.  Procedures Procedures (including critical care time)  Medications Ordered in ED Medications - No data to display  Results for orders placed or performed during the hospital encounter of 09/02/16  Basic metabolic panel  Result Value Ref Range   Sodium 135 135 - 145 mmol/L   Potassium 4.4 3.5 - 5.1 mmol/L   Chloride 104 101 - 111 mmol/L   CO2 22 22 - 32 mmol/L   Glucose, Bld 182 (H) 65 - 99 mg/dL   BUN 14 6 - 20 mg/dL   Creatinine, Ser  0.56 (L) 0.61 - 1.24 mg/dL   Calcium 9.6 8.9 - 16.110.3 mg/dL   GFR calc non Af Amer >60 >60 mL/min   GFR calc Af Amer >60 >60 mL/min   Anion gap 9 5 - 15  CBC  Result Value Ref Range   WBC 6.0 4.0 - 10.5 K/uL   RBC 5.37 4.22 - 5.81 MIL/uL   Hemoglobin 14.0 13.0 - 17.0 g/dL   HCT 09.644.5 04.539.0 - 40.952.0 %   MCV 82.9 78.0 - 100.0 fL   MCH 26.1 26.0 - 34.0 pg   MCHC 31.5 30.0 - 36.0 g/dL   RDW 81.115.1 91.411.5 - 78.215.5 %   Platelets 234 150 - 400 K/uL  I-Stat Troponin, ED ([facility])  Result Value Ref Range   Troponin i, poc 0.00 0.00 - 0.08 ng/mL   Comment 3           Mr Brain Wo Contrast  Result Date: 09/02/2016 CLINICAL DATA:  60 year old hypertensive diabetic male off balance this morning. Dizziness. Nausea. Spinning room. Initial encounter. EXAM: MRI HEAD WITHOUT CONTRAST TECHNIQUE: Multiplanar, multiecho pulse sequences of the brain and surrounding structures were obtained without intravenous contrast. COMPARISON:  07/31/2006 MR. FINDINGS: Brain: No acute infarct or intracranial hemorrhage. Remote small left cerebellar infarct. Minimal white matter changes may represent result of minimal chronic microvascular disease. Mild global atrophy without hydrocephalus. No intracranial mass  lesion noted on this unenhanced exam. Small pituitary gland. Vascular: Major intracranial vascular structures are patent. Skull and upper cervical spine: No acute abnormality. Sinuses/Orbits: No acute abnormality. Other: Negative. IMPRESSION: No acute infarct or intracranial hemorrhage. Remote small left cerebellar infarct. Minimal chronic microvascular disease. Mild global atrophy. Electronically Signed   By: Lacy DuverneySteven  Olson M.D.   On: 09/02/2016 19:08   Initial Impression / Assessment and Plan / ED Course  I have reviewed the triage vital signs and the nursing notes.  Pertinent labs & imaging results that were available during my care of the patient were reviewed by me and considered in my medical decision making (see chart for details).  Clinical Course  7:25 PM patient remains asymptomatic alert and motor Glasgow Coma Score 15. No evidence of acute stroke. Plan prescription meclizine follow up with Dr.Kadakia as needed Final Clinical Impressions(s) / ED Diagnoses   Final diagnoses:  None    diagnosis #1 vertigo #2 hyperglycemia New Prescriptions New Prescriptions   No medications on file     Doug SouSam Arliss Hepburn, MD 09/02/16 1929

## 2016-09-02 NOTE — ED Triage Notes (Signed)
Patient complains of dizziness when he awoke this am. Complains of nausea with same. States he feels as if the room is spinning. Alert and oriented, no neuro deficits

## 2016-09-02 NOTE — Discharge Instructions (Signed)
No evidence of stroke today however your MRI scan showed that you may have had a stroke some time in the past. Your blood sugar was mildly elevated today at 182. Take the medication prescribed as needed for dizziness. Call Dr. Algie CofferKadakia tomorrow to let him know that you were here today. He may want to see you in the office.

## 2018-01-04 ENCOUNTER — Emergency Department (HOSPITAL_COMMUNITY)
Admission: EM | Admit: 2018-01-04 | Discharge: 2018-01-04 | Disposition: A | Discharge status: Home / Self Care | Payer: Medicaid Other | Attending: Emergency Medicine | Admitting: Emergency Medicine

## 2018-01-04 ENCOUNTER — Encounter (HOSPITAL_COMMUNITY): Payer: Self-pay | Admitting: Emergency Medicine

## 2018-01-04 ENCOUNTER — Emergency Department (HOSPITAL_COMMUNITY): Payer: Medicaid Other

## 2018-01-04 DIAGNOSIS — Y998 Other external cause status: Secondary | ICD-10-CM | POA: Diagnosis not present

## 2018-01-04 DIAGNOSIS — Z794 Long term (current) use of insulin: Secondary | ICD-10-CM | POA: Diagnosis not present

## 2018-01-04 DIAGNOSIS — Z951 Presence of aortocoronary bypass graft: Secondary | ICD-10-CM | POA: Diagnosis not present

## 2018-01-04 DIAGNOSIS — M79605 Pain in left leg: Secondary | ICD-10-CM

## 2018-01-04 DIAGNOSIS — S8012XA Contusion of left lower leg, initial encounter: Secondary | ICD-10-CM | POA: Insufficient documentation

## 2018-01-04 DIAGNOSIS — F17210 Nicotine dependence, cigarettes, uncomplicated: Secondary | ICD-10-CM | POA: Insufficient documentation

## 2018-01-04 DIAGNOSIS — W108XXA Fall (on) (from) other stairs and steps, initial encounter: Secondary | ICD-10-CM | POA: Insufficient documentation

## 2018-01-04 DIAGNOSIS — E119 Type 2 diabetes mellitus without complications: Secondary | ICD-10-CM | POA: Insufficient documentation

## 2018-01-04 DIAGNOSIS — Y9389 Activity, other specified: Secondary | ICD-10-CM | POA: Insufficient documentation

## 2018-01-04 DIAGNOSIS — Z7982 Long term (current) use of aspirin: Secondary | ICD-10-CM | POA: Insufficient documentation

## 2018-01-04 DIAGNOSIS — S0081XA Abrasion of other part of head, initial encounter: Secondary | ICD-10-CM | POA: Diagnosis not present

## 2018-01-04 DIAGNOSIS — Y929 Unspecified place or not applicable: Secondary | ICD-10-CM | POA: Diagnosis not present

## 2018-01-04 DIAGNOSIS — T07XXXA Unspecified multiple injuries, initial encounter: Secondary | ICD-10-CM

## 2018-01-04 DIAGNOSIS — S8992XA Unspecified injury of left lower leg, initial encounter: Secondary | ICD-10-CM | POA: Diagnosis present

## 2018-01-04 DIAGNOSIS — I2510 Atherosclerotic heart disease of native coronary artery without angina pectoris: Secondary | ICD-10-CM | POA: Insufficient documentation

## 2018-01-04 DIAGNOSIS — Z79899 Other long term (current) drug therapy: Secondary | ICD-10-CM | POA: Diagnosis not present

## 2018-01-04 DIAGNOSIS — S80812A Abrasion, left lower leg, initial encounter: Secondary | ICD-10-CM | POA: Diagnosis not present

## 2018-01-04 DIAGNOSIS — T148XXA Other injury of unspecified body region, initial encounter: Secondary | ICD-10-CM

## 2018-01-04 MED ORDER — BACITRACIN ZINC 500 UNIT/GM EX OINT
TOPICAL_OINTMENT | Freq: Once | CUTANEOUS | Status: AC
  Administered 2018-01-04: 1 via TOPICAL
  Filled 2018-01-04: qty 0.9

## 2018-01-04 MED ORDER — ACETAMINOPHEN 500 MG PO TABS
1000.0000 mg | ORAL_TABLET | Freq: Once | ORAL | Status: AC
  Administered 2018-01-04: 1000 mg via ORAL
  Filled 2018-01-04: qty 2

## 2018-01-04 NOTE — ED Notes (Signed)
Pt. Denied wanting ice on wounds.

## 2018-01-04 NOTE — Discharge Instructions (Signed)
You can take Tylenol or Ibuprofen as directed for pain. You can alternate Tylenol and Ibuprofen every 4 hours. If you take Tylenol at 1pm, then you can take Ibuprofen at 5pm. Then you can take Tylenol again at 9pm.   Follow the RICE (Rest, Ice, Compression, Elevation) protocol as directed.   Keep the wound clean and dry.  He can gently wash the wound with warm water and soap.  If you apply bandages, the wound need to be dry.  Apply bacitracin or Neosporin 2 times a day to prevent infection.  Follow-up with your primary care doctor in the next 2-4 days for further evaluation.  Return the emergency department for any worsening pain, worsening swelling of the leg, redness or warmth around her wounds, drainage from her wounds, numbness/weakness of your legs or any other worsening or concerning symptoms.

## 2018-01-04 NOTE — ED Triage Notes (Signed)
Pt to ER for evaluation of left lower extremity pain and swelling after falling and landing on left leg. Bleeding controlled. Pt ambulatory without difficulty.

## 2018-01-04 NOTE — ED Provider Notes (Signed)
[facility] Provider Note   CSN: [id number] Arrival date & time: 01/04/18  1816     History   Chief Complaint Chief Complaint  Patient presents with  . Leg Pain    HPI Dustin Rodriguez is a 62 y.o. male who presents for evaluation of left lower extremity pain, swelling, injury after a mechanical fall that occurred 1 hour prior to ED arrival.  Patient reports that he tripped over a step, causing him to fall and hit his left lower extremity on the edge of the step.  He states that he did not hit his head or lose consciousness.  He remembers the entire event.  He has been able to ambulate on leg but reports worsening pain.  He has noticed some swelling to the anterior aspect of the left tib-fib.  Patient states that his tetanus is up-to-date.  He denies any numbness/weakness, CP.   The history is provided by the patient.    Past Medical History:  Diagnosis Date  . Chronic lower back pain   . Coronary artery disease   . Dilated cardiomyopathy (HCC)    Hattie Perch/notes 09/16/2015  . Hypercholesterolemia    "diet controlled" (09/16/2015)  . Type II diabetes mellitus [facility](HCC)     Patient Active Problem List   Diagnosis Date Noted  . 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 Chest pain at rest 09/16/2015  . Acute coronary syndrome (HCC) 09/16/2015    Past Surgical History:  Procedure Laterality Date  . CARDIAC CATHETERIZATION N/A 09/16/2015   Procedure: Right/Left Heart Cath and Coronary Angiography;  Surgeon: Orpah CobbAjay Kadakia, MD;  [facility]: [facility] INVASIVE CV LAB;  Service: Cardiovascular;  Laterality: N/A;  . CORONARY ARTERY BYPASS GRAFT N/A 09/19/2015   Procedure: CORONARY ARTERY BYPASS GRAFTING (CABG);  Surgeon: Delight OvensEdward B Gerhardt, MD;  [facility];  Service: Open Heart Surgery;  Laterality: N/A;  . TEE WITHOUT CARDIOVERSION N/A 09/19/2015   Procedure: TRANSESOPHAGEAL ECHOCARDIOGRAM (TEE);  Surgeon: Delight OvensEdward B Gerhardt, MD;  [facility];  Service: Open Heart Surgery;   Laterality: N/A;       Home Medications    Prior to Admission medications   Medication Sig Start Date End Date Taking? Authorizing Provider  aspirin EC 325 MG EC tablet Take 1 tablet (325 mg total) by mouth daily. 09/26/15   Gold, Glenice LaineWayne E, PA-C  atorvastatin (LIPITOR) 80 MG tablet Take 1 tablet (80 mg total) by mouth daily at 6 PM. 09/26/15   Gold, Glenice LaineWayne E, PA-C  carvedilol (COREG) 12.5 MG tablet Take 1 tablet (12.5 mg total) by mouth 2 (two) times daily with a meal. 09/26/15   Gold, Wayne E, PA-C  Cyanocobalamin (VITAMIN B-12 PO) Take 1 tablet by mouth daily.    [provider]  furosemide (LASIX) 40 MG tablet Take 1 tablet (40 mg total) by mouth daily. Patient taking differently: Take 40 mg by mouth daily as needed for fluid.  09/26/15   Gold, Wayne E, PA-C  glimepiride (AMARYL) 4 MG tablet Take 4 mg by mouth 2 (two) times daily.    [provider]  insulin detemir (LEVEMIR) 100 UNIT/ML injection Inject 0.2 mLs (20 Units total) into the skin at bedtime. Patient taking differently: Inject 30 Units into the skin at bedtime.  09/26/15   Rowe ClackGold, Wayne E, PA-C  IRON PO Take 1 tablet by mouth daily.    [provider]  isosorbide mononitrate (IMDUR) 30 MG 24 hr tablet Take 1 tablet (30 mg total)  by mouth daily. 09/26/15   Gold, Wayne E, PA-C  lisinopril (PRINIVIL,ZESTRIL) 2.5 MG tablet Take 1 tablet (2.5 mg total) by mouth daily. 09/26/15   Gold, Glenice Laine, PA-C  meclizine (ANTIVERT) 25 MG tablet Take 1 tablet (25 mg total) by mouth 3 (three) times daily as needed for dizziness. 09/02/16   Doug Sou, MD  metFORMIN (GLUCOPHAGE) 1000 MG tablet Take 1,000 mg by mouth 2 (two) times daily with a meal.    [provider]  potassium chloride (K-DUR,KLOR-CON) 10 MEQ tablet Take 1 tablet (10 mEq total) by mouth daily. Patient taking differently: Take 10 mEq by mouth daily as needed (Takes with Lasix).  09/26/15   Rowe Clack, PA-C    Family History History  reviewed. No pertinent family history.  Social History Social History   Tobacco Use  . Smoking status: Current Every Day Smoker    Packs/day: 1.25    Years: 21.00    Pack years: 26.25    Types: Cigarettes  . Smokeless tobacco: Never Used  Substance Use Topics  . Alcohol use: No  . Drug use: No     Allergies   Patient has no known allergies.   Review of Systems Review of Systems  Musculoskeletal:       LLE pain     Physical Exam Updated Vital Signs BP (!) 150/64   Pulse 65   Temp 97.8 F (36.6 C) (Oral)   Resp 18   SpO2 100%   Physical Exam  Constitutional: He appears well-developed and well-nourished.  HENT:  Head: Normocephalic and atraumatic.  Eyes: Conjunctivae and EOM are normal. Right eye exhibits no discharge. Left eye exhibits no discharge. No scleral icterus.  Cardiovascular:  Pulses:      Dorsalis pedis pulses are 2+ on the right side, and 2+ on the left side.  Pulmonary/Chest: Effort normal.  Musculoskeletal:  Tenderness palpation to the anterior aspect of the mid left tib-fib.  There is some overlying soft tissue swelling and evidence of hematoma.  No overlying warmth, erythema.  No deformity or crepitus noted.  Flexion/extension of left lower extremity intact without any difficulty.  No tenderness palpation to left knee, left ankle.  Neurological: He is alert.  Sensation intact along major nerve distributions of LLE.  Follows commands, Moves all extremities  5/5 strength to BUE and BLE   Skin: Skin is warm and dry. Capillary refill takes less than 2 seconds.  Good distal cap refill. LLE is not dusky in appearance or cool to touch. Several small scattered abrasions noted to the anterior aspect of LLE. Small superficial abrasion noted to the left chin.   Psychiatric: He has a normal mood and affect. His speech is normal and behavior is normal.  Nursing note and vitals reviewed.    ED Treatments / Results  Labs (all labs ordered are listed, but  only abnormal results are displayed) Labs Reviewed - No data to display  EKG  EKG Interpretation None       Radiology Dg Tibia/fibula Left  Result Date: 01/04/2018 CLINICAL DATA:  Injury.  Fall. EXAM: LEFT TIBIA AND FIBULA - 2 VIEW COMPARISON:  None. FINDINGS: There is no evidence of fracture or other focal bone lesions. Soft tissues are unremarkable. IMPRESSION: Negative. Electronically Signed   By: Signa Kell M.D.   On: 01/04/2018 19:53    Procedures Procedures (including critical care time)  Medications Ordered in ED Medications  acetaminophen (TYLENOL) tablet 1,000 mg (1,000 mg Oral Given 01/04/18 2012)  bacitracin ointment (1 application Topical Given 01/04/18 2013)     Initial Impression / Assessment and Plan / ED Course  I have reviewed the triage vital signs and the nursing notes.  Pertinent labs & imaging results that were available during my care of the patient were reviewed by me and considered in my medical decision making (see chart for details).     62 y.o. M who presents for evaluation of lower leg pain that began after mechanical fall that occurred 1 hour prior to ED arrival.  Patient reports that tetanus is up-to-date.  Has been able to ambulate on the leg since then. Did Not hit his head or lose consciousness. Patient is afebrile, non-toxic appearing, sitting comfortably on examination table. Vital signs reviewed and stable.  Patient slightly hypertensive, likely secondary to pain.  On exam, patient has multiple, scattered superficial abrasions noted to the anterior aspect of the left lower extremity.  There is also an area of soft tissue swelling consistent with hematoma.  Good distal pulses.  Patient is neurovascularly intact.  There is also a small superficial abrasion noted to the left chin.  No evidence of laceration that needs repaired.  Patient thinks he hit face on the edge of the step where he fell but states that he did not hit his head.  Consider  contusion versus hematoma versus fracture versus dislocation.  No evidence of laceration that need to be repaired here in the department.  We will plan to provide wound care.  Patient states that tetanus is up-to-date.  X-ray reviewed.  Negative for any acute fracture or dislocation.  Discussed results with patient.  Patient ambulating in the part without any difficulty.  Wound care provided in the department.  Bacitracin applied to wound.  Wound care instructions discussed with patient. Patient instructed follow-up with primary care doctor the next 24-48 hours for further evaluation.  Patient instructed on conservative RICE therapy at home. Patient had ample opportunity for questions and discussion. All patient's questions were answered with full understanding. Strict return precautions discussed. Patient expresses understanding and agreement to plan.    Final Clinical Impressions(s) / ED Diagnoses   Final diagnoses:  Left leg pain  Multiple abrasions  Hematoma    ED Discharge Orders    None       Rosana Hoes 01/04/18 2035    Margarita Grizzle, MD 01/05/18 1524

## 2019-12-05 IMAGING — DX DG TIBIA/FIBULA 2V*L*
2 series · 2 of 2 positions shown · non-contrast
Comparison: None.

CLINICAL DATA: Injury.  Fall.

EXAM:
LEFT TIBIA AND FIBULA - 2 VIEW

[tibia ap]
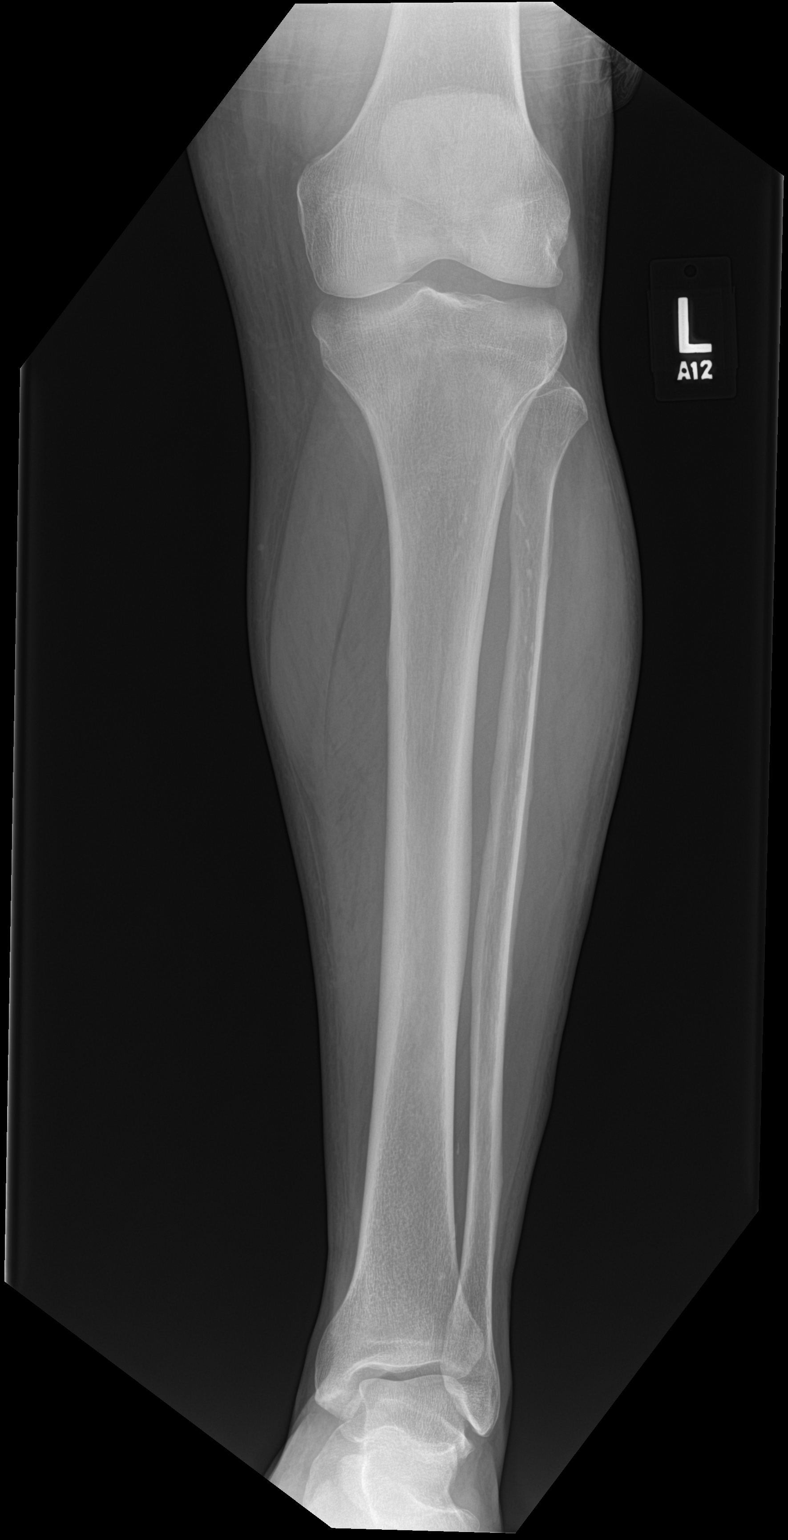

[tibia lat]
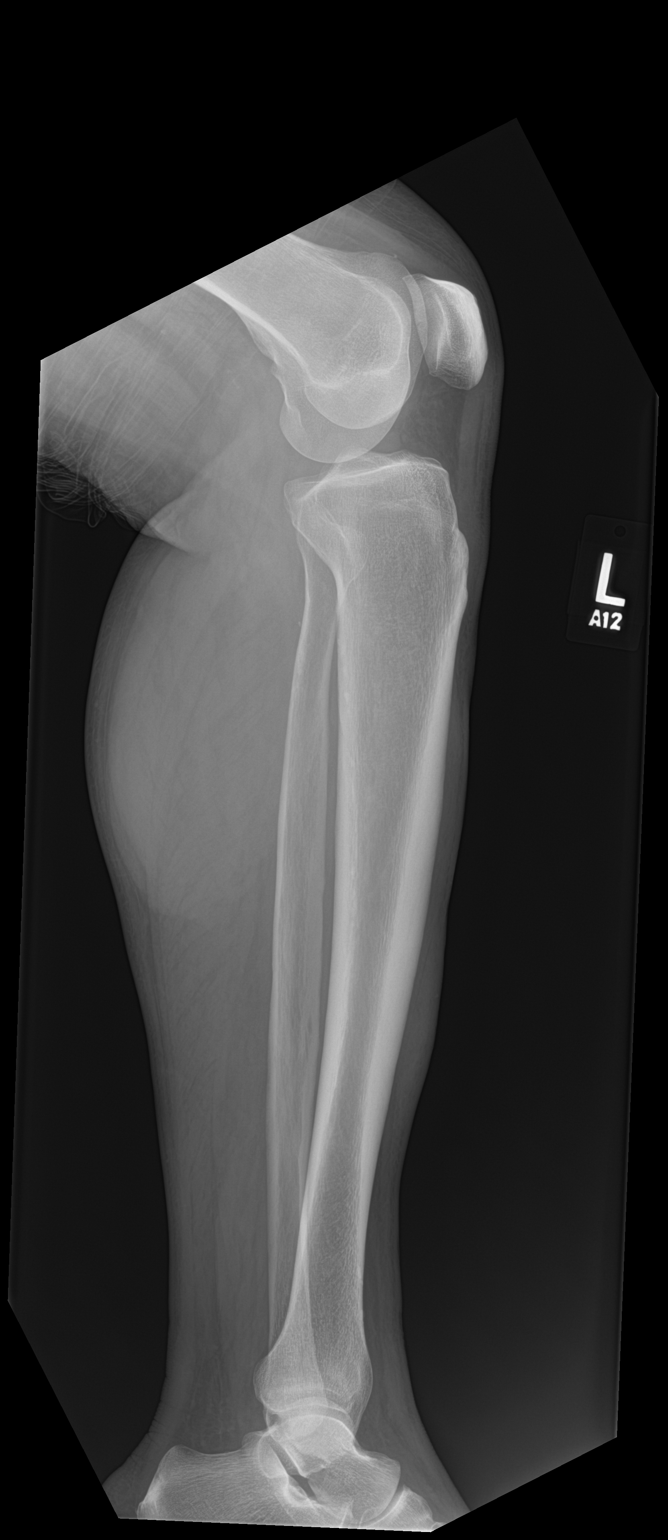

[2 of 2 positions shown; findings below may reference images not displayed]

FINDINGS: There is no evidence of fracture or other focal bone lesions. Soft
tissues are unremarkable.
IMPRESSION: Negative.

## 2021-05-29 ENCOUNTER — Other Ambulatory Visit: Payer: Self-pay | Admitting: Orthopedic Surgery

## 2021-05-29 DIAGNOSIS — M5416 Radiculopathy, lumbar region: Secondary | ICD-10-CM

## 2022-06-21 ENCOUNTER — Emergency Department (HOSPITAL_COMMUNITY)
Admission: EM | Admit: 2022-06-21 | Discharge: 2022-06-21 | Disposition: A | Discharge status: Home / Self Care | Payer: Medicare Other | Attending: Emergency Medicine | Admitting: Emergency Medicine

## 2022-06-21 ENCOUNTER — Other Ambulatory Visit: Payer: Self-pay

## 2022-06-21 ENCOUNTER — Emergency Department (HOSPITAL_COMMUNITY): Payer: Medicare Other

## 2022-06-21 ENCOUNTER — Encounter (HOSPITAL_COMMUNITY): Payer: Self-pay

## 2022-06-21 DIAGNOSIS — W268XXA Contact with other sharp object(s), not elsewhere classified, initial encounter: Secondary | ICD-10-CM | POA: Insufficient documentation

## 2022-06-21 DIAGNOSIS — S61217A Laceration without foreign body of left little finger without damage to nail, initial encounter: Secondary | ICD-10-CM | POA: Diagnosis not present

## 2022-06-21 DIAGNOSIS — Z794 Long term (current) use of insulin: Secondary | ICD-10-CM | POA: Diagnosis not present

## 2022-06-21 DIAGNOSIS — I2510 Atherosclerotic heart disease of native coronary artery without angina pectoris: Secondary | ICD-10-CM | POA: Insufficient documentation

## 2022-06-21 DIAGNOSIS — Z7982 Long term (current) use of aspirin: Secondary | ICD-10-CM | POA: Insufficient documentation

## 2022-06-21 DIAGNOSIS — E119 Type 2 diabetes mellitus without complications: Secondary | ICD-10-CM | POA: Diagnosis not present

## 2022-06-21 DIAGNOSIS — Z79899 Other long term (current) drug therapy: Secondary | ICD-10-CM | POA: Insufficient documentation

## 2022-06-21 DIAGNOSIS — S6992XA Unspecified injury of left wrist, hand and finger(s), initial encounter: Secondary | ICD-10-CM | POA: Diagnosis present

## 2022-06-21 DIAGNOSIS — Z7984 Long term (current) use of oral hypoglycemic drugs: Secondary | ICD-10-CM | POA: Insufficient documentation

## 2022-06-21 MED ORDER — MUPIROCIN CALCIUM 2 % EX CREA: 1.0000 | TOPICAL_CREAM | Freq: Two times a day (BID) | CUTANEOUS | 0 refills | Status: AC

## 2022-06-21 MED ORDER — BUPIVACAINE HCL (PF) 0.5 % IJ SOLN
10.0000 mL | Freq: Once | INTRAMUSCULAR | Status: DC
  Filled 2022-06-21: qty 10

## 2022-06-21 NOTE — ED Notes (Signed)
Marcaine pulled and provided to MD [facility]

## 2022-06-21 NOTE — Discharge Instructions (Addendum)
Return for increased redness or swelling.  Sutures need to be removed in 7-10 days.

## 2022-06-21 NOTE — ED Provider Notes (Addendum)
[facility] EMERGENCY DEPARTMENT Provider Note   CSN: [id number] Arrival date & time: 06/21/22  1241     History  Chief Complaint  Patient presents with   Extremity Laceration    Left hand    Dustin Rodriguez is a 66 y.o. male.  Pt is a 66 yo male with a pmhx significant for CAD, high cholesterol, DM2, dilated CM.  He was cutting meat at work and cut his left hand.  He said he had a hard time getting it to stop bleeding.  He said he's on thinners, but is only on ASA.  Pt does not have any difficulty moving the hand.  No numbness.Tetanus utd.       Home Medications Prior to Admission medications   Medication Sig Start Date End Date Taking? Authorizing Provider  mupirocin cream (BACTROBAN) 2 % Apply 1 Application topically 2 (two) times daily. 06/21/22  Yes Jacalyn Lefevre, MD  aspirin EC 325 MG EC tablet Take 1 tablet (325 mg total) by mouth daily. 09/26/15   Gold, Glenice Laine, PA-C  atorvastatin (LIPITOR) 80 MG tablet Take 1 tablet (80 mg total) by mouth daily at 6 PM. 09/26/15   Gold, Glenice Laine, PA-C  carvedilol (COREG) 12.5 MG tablet Take 1 tablet (12.5 mg total) by mouth 2 (two) times daily with a meal. 09/26/15   Gold, Wayne E, PA-C  Cyanocobalamin (VITAMIN B-12 PO) Take 1 tablet by mouth daily.    [provider]  furosemide (LASIX) 40 MG tablet Take 1 tablet (40 mg total) by mouth daily. Patient taking differently: Take 40 mg by mouth daily as needed for fluid.  09/26/15   Gold, Wayne E, PA-C  glimepiride (AMARYL) 4 MG tablet Take 4 mg by mouth 2 (two) times daily.    [provider]  insulin detemir (LEVEMIR) 100 UNIT/ML injection Inject 0.2 mLs (20 Units total) into the skin at bedtime. Patient taking differently: Inject 30 Units into the skin at bedtime.  09/26/15   Rowe Clack, PA-C  IRON PO Take 1 tablet by mouth daily.    [provider]  isosorbide mononitrate (IMDUR) 30 MG 24 hr tablet Take 1 tablet (30 mg total) by mouth daily.  09/26/15   Gold, Wayne E, PA-C  lisinopril (PRINIVIL,ZESTRIL) 2.5 MG tablet Take 1 tablet (2.5 mg total) by mouth daily. 09/26/15   Gold, Glenice Laine, PA-C  meclizine (ANTIVERT) 25 MG tablet Take 1 tablet (25 mg total) by mouth 3 (three) times daily as needed for dizziness. 09/02/16   Doug Sou, MD  metFORMIN (GLUCOPHAGE) 1000 MG tablet Take 1,000 mg by mouth 2 (two) times daily with a meal.    [provider]  potassium chloride (K-DUR,KLOR-CON) 10 MEQ tablet Take 1 tablet (10 mEq total) by mouth daily. Patient taking differently: Take 10 mEq by mouth daily as needed (Takes with Lasix).  09/26/15   Rowe Clack, PA-C      Allergies    Patient has no known allergies.    Review of Systems   Review of Systems  Skin:  Positive for wound.  All other systems reviewed and are negative.   Physical Exam Updated Vital Signs BP 121/63 (BP Location: Right Arm)   Pulse (!) 59   Temp 98.1 F (36.7 C) (Oral)   Resp 18   SpO2 94%  Physical Exam Vitals and nursing note reviewed.  Constitutional:      Appearance: Normal appearance.  HENT:     Head:  Normocephalic and atraumatic.     Right Ear: External ear normal.     Left Ear: External ear normal.     Nose: Nose normal.     Mouth/Throat:     Mouth: Mucous membranes are moist.     Pharynx: Oropharynx is clear.  Eyes:     Extraocular Movements: Extraocular movements intact.     Conjunctiva/sclera: Conjunctivae normal.     Pupils: Pupils are equal, round, and reactive to light.  Cardiovascular:     Rate and Rhythm: Normal rate and regular rhythm.     Pulses: Normal pulses.     Heart sounds: Normal heart sounds.  Pulmonary:     Effort: Pulmonary effort is normal.     Breath sounds: Normal breath sounds.  Abdominal:     General: Abdomen is flat. Bowel sounds are normal.     Palpations: Abdomen is soft.  Musculoskeletal:        General: Normal range of motion.     Cervical back: Normal range of motion and neck supple.   Skin:    Capillary Refill: Capillary refill takes less than 2 seconds.     Comments: 3 cm lac left palm below left 5th finger  Neurological:     General: No focal deficit present.     Mental Status: He is alert and oriented to person, place, and time.  Psychiatric:        Mood and Affect: Mood normal.        Behavior: Behavior normal.     ED Results / Procedures / Treatments   Labs (all labs ordered are listed, but only abnormal results are displayed) Labs Reviewed - No data to display  EKG None  Radiology DG Hand Complete Left  Result Date: 06/21/2022 CLINICAL DATA:  lac palm EXAM: LEFT HAND - COMPLETE 3+ VIEW COMPARISON:  None Available. FINDINGS: There is no evidence of fracture or dislocation. There is no evidence of arthropathy or other focal bone abnormality. Soft tissues are unremarkable. IMPRESSION: Negative. Electronically Signed   By: Feliberto Harts M.D.   On: 06/21/2022 14:28    Procedures .Marland KitchenLaceration Repair  Date/Time: 06/21/2022 5:08 PM  Performed by: Jacalyn Lefevre, MD Authorized by: Jacalyn Lefevre, MD   Consent:    Consent obtained:  Verbal Universal protocol:    Patient identity confirmed:  Verbally with patient Anesthesia:    Anesthesia method:  Local infiltration   Local anesthetic:  Bupivacaine 0.5% w/o epi Laceration details:    Location:  Hand   Hand location:  L palm   Length (cm):  3 Pre-procedure details:    Preparation:  Patient was prepped and draped in usual sterile fashion Treatment:    Area cleansed with:  Saline   Amount of cleaning:  Standard Skin repair:    Repair method:  Sutures   Suture size:  4-0   Suture material:  Prolene   Suture technique:  Simple interrupted   Number of sutures:  3 Approximation:    Approximation:  Close Repair type:    Repair type:  Simple Post-procedure details:    Dressing:  Antibiotic ointment and non-adherent dressing   Procedure completion:  Tolerated well, no immediate complications      Medications Ordered in ED Medications  bupivacaine(PF) (MARCAINE) 0.5 % injection 10 mL (has no administration in time range)    ED Course/ Medical Decision Making/ A&P  Medical Decision Making Risk Prescription drug management.   Pt's xray reviewed and is negative.  I agree with the radiologist.  Wound sutured and pt d/c.  He is a diabetic and is concerned for infection.  He is traveling to the [place] in a few days.  So, I will give him a rx for mupirocin.  The wound is clean and there is no indication for oral abx.  He is to return if he has increased redness or swelling.         Final Clinical Impression(s) / ED Diagnoses Final diagnoses:  Laceration of left little finger without foreign body, nail damage status unspecified, initial encounter    Rx / DC Orders ED Discharge Orders          Ordered    mupirocin cream (BACTROBAN) 2 %  2 times daily        06/21/22 1701              Jacalyn Lefevre, MD 06/21/22 1710    Jacalyn Lefevre, MD 06/21/22 1712    Jacalyn Lefevre, MD 06/21/22 1732

## 2022-06-21 NOTE — ED Triage Notes (Signed)
Pt arrived POV from home c/o a left hand laceration that happened while he was cleaning meat of the bones at his house for dinner. Bleeding is controlled, but pt states he is on a blood thinner and wanted to get it checked out.

## 2022-06-21 NOTE — ED Provider Triage Note (Signed)
Emergency Medicine Provider Triage Evaluation Note  Dustin Rodriguez , a 66 y.o. male  was evaluated in triage.  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  He is currently on anticoagulation and states that he had trouble stopping the bleeding but this improved with pressure.  Denies any numbness or weakness.  Review of Systems  Positive: Left palm laceration Negative: Numbness, weakness  Physical Exam  There were no vitals taken for this visit. Gen:   Awake, no distress   Resp:  Normal effort  MSK:   Moves extremities without difficulty  Other:  3 cm laceration to the left palm near the fifth digit no active bleeding  Medical Decision Making  Medically screening exam initiated at 1:45 PM.  Appropriate orders placed.  JANE BROUGHTON was informed that the remainder of the evaluation will be completed by another provider, this initial triage assessment does not replace that evaluation, and the importance of remaining in the ED until their evaluation is complete.  Imaging ordered   Dietrich Pates, PA-C 06/21/22 1346
